# Patient Record
Sex: Male | Born: 1975 | Race: Black or African American | Hispanic: No | Marital: Single | Smoking: Current every day smoker
Health system: Southern US, Community
[De-identification: ages and names within clinical notes are randomized; demographics above are authoritative.]

## PROBLEM LIST (undated history)

## (undated) DIAGNOSIS — F329 Major depressive disorder, single episode, unspecified: Secondary | ICD-10-CM

## (undated) DIAGNOSIS — I10 Essential (primary) hypertension: Secondary | ICD-10-CM

## (undated) DIAGNOSIS — M549 Dorsalgia, unspecified: Secondary | ICD-10-CM

## (undated) DIAGNOSIS — F32A Depression, unspecified: Secondary | ICD-10-CM

---

## 2005-04-01 ENCOUNTER — Ambulatory Visit: Payer: Self-pay | Admitting: Nurse Practitioner

## 2005-04-07 ENCOUNTER — Ambulatory Visit: Payer: Self-pay | Admitting: *Deleted

## 2005-05-29 ENCOUNTER — Ambulatory Visit: Payer: Self-pay | Admitting: Nurse Practitioner

## 2008-01-30 ENCOUNTER — Ambulatory Visit: Payer: Self-pay | Admitting: Internal Medicine

## 2008-01-30 LAB — CONVERTED CEMR LAB
AST: 16 units/L (ref 0–37)
Albumin: 4.8 g/dL (ref 3.5–5.2)
Alkaline Phosphatase: 52 units/L (ref 39–117)
BUN: 14 mg/dL (ref 6–23)
Basophils Relative: 0 % (ref 0–1)
Calcium: 9.9 mg/dL (ref 8.4–10.5)
Chloride: 101 meq/L (ref 96–112)
Creatinine, Ser: 0.93 mg/dL (ref 0.40–1.50)
Eosinophils Absolute: 0.8 10*3/uL — ABNORMAL HIGH (ref 0.0–0.7)
HCT: 44.2 % (ref 39.0–52.0)
MCHC: 31.4 g/dL (ref 30.0–36.0)
MCV: 85.2 fL (ref 78.0–100.0)
Monocytes Absolute: 0.6 10*3/uL (ref 0.1–1.0)
Monocytes Relative: 9 % (ref 3–12)
Neutro Abs: 3.1 10*3/uL (ref 1.7–7.7)
Neutrophils Relative %: 44 % (ref 43–77)
Platelets: 288 10*3/uL (ref 150–400)
RBC: 5.19 M/uL (ref 4.22–5.81)
RDW: 14.6 % (ref 11.5–15.5)
Total Bilirubin: 0.4 mg/dL (ref 0.3–1.2)

## 2008-02-03 ENCOUNTER — Ambulatory Visit: Payer: Self-pay | Admitting: Internal Medicine

## 2008-04-13 ENCOUNTER — Ambulatory Visit: Payer: Self-pay | Admitting: Internal Medicine

## 2008-05-21 ENCOUNTER — Ambulatory Visit: Payer: Self-pay | Admitting: Internal Medicine

## 2008-05-28 ENCOUNTER — Ambulatory Visit: Payer: Self-pay | Admitting: Internal Medicine

## 2009-03-08 ENCOUNTER — Encounter (INDEPENDENT_AMBULATORY_CARE_PROVIDER_SITE_OTHER): Payer: Self-pay | Admitting: Adult Health

## 2009-03-08 ENCOUNTER — Ambulatory Visit: Payer: Self-pay | Admitting: Internal Medicine

## 2009-03-08 LAB — CONVERTED CEMR LAB
Basophils Absolute: 0 10*3/uL (ref 0.0–0.1)
Basophils Relative: 0 % (ref 0–1)
Hemoglobin: 14.5 g/dL (ref 13.0–17.0)
Lymphocytes Relative: 43 % (ref 12–46)
Lymphs Abs: 3.1 10*3/uL (ref 0.7–4.0)
MCHC: 33.1 g/dL (ref 30.0–36.0)
Monocytes Absolute: 0.8 10*3/uL (ref 0.1–1.0)
Monocytes Relative: 11 % (ref 3–12)
Neutro Abs: 3.1 10*3/uL (ref 1.7–7.7)
Neutrophils Relative %: 43 % (ref 43–77)
Platelets: 308 10*3/uL (ref 150–400)

## 2009-06-27 ENCOUNTER — Emergency Department (HOSPITAL_COMMUNITY): Admission: EM | Admit: 2009-06-27 | Discharge: 2009-06-27 | Payer: Self-pay | Admitting: Emergency Medicine

## 2009-07-02 ENCOUNTER — Ambulatory Visit: Payer: Self-pay | Admitting: Family Medicine

## 2009-07-22 ENCOUNTER — Ambulatory Visit: Payer: Self-pay | Admitting: Internal Medicine

## 2009-07-22 LAB — CONVERTED CEMR LAB
AST: 21 units/L (ref 0–37)
Albumin: 4.5 g/dL (ref 3.5–5.2)
Alkaline Phosphatase: 44 units/L (ref 39–117)
CRP: 0.9 mg/dL — ABNORMAL HIGH (ref <0.6)
Calcium: 9.9 mg/dL (ref 8.4–10.5)
Chloride: 106 meq/L (ref 96–112)
Hgb A1c MFr Bld: 6.2 % — ABNORMAL HIGH (ref <5.7)
Total Bilirubin: 0.3 mg/dL (ref 0.3–1.2)
Total Protein: 7.5 g/dL (ref 6.0–8.3)
Uric Acid, Serum: 9.7 mg/dL — ABNORMAL HIGH (ref 4.0–7.8)

## 2009-12-09 ENCOUNTER — Emergency Department (HOSPITAL_COMMUNITY): Admission: EM | Admit: 2009-12-09 | Discharge: 2009-12-09 | Payer: Self-pay | Admitting: Emergency Medicine

## 2010-03-23 ENCOUNTER — Encounter: Payer: Self-pay | Admitting: Internal Medicine

## 2012-08-25 ENCOUNTER — Encounter (HOSPITAL_COMMUNITY): Payer: Self-pay | Admitting: *Deleted

## 2012-08-25 ENCOUNTER — Emergency Department (HOSPITAL_COMMUNITY)
Admission: EM | Admit: 2012-08-25 | Discharge: 2012-08-26 | Disposition: A | Discharge status: Home / Self Care | Payer: Self-pay | Attending: Emergency Medicine | Admitting: Emergency Medicine

## 2012-08-25 DIAGNOSIS — F3289 Other specified depressive episodes: Secondary | ICD-10-CM | POA: Insufficient documentation

## 2012-08-25 DIAGNOSIS — F329 Major depressive disorder, single episode, unspecified: Secondary | ICD-10-CM

## 2012-08-25 DIAGNOSIS — I10 Essential (primary) hypertension: Secondary | ICD-10-CM | POA: Insufficient documentation

## 2012-08-25 DIAGNOSIS — R45851 Suicidal ideations: Secondary | ICD-10-CM | POA: Insufficient documentation

## 2012-08-25 DIAGNOSIS — M549 Dorsalgia, unspecified: Secondary | ICD-10-CM | POA: Insufficient documentation

## 2012-08-25 DIAGNOSIS — G8929 Other chronic pain: Secondary | ICD-10-CM | POA: Insufficient documentation

## 2012-08-25 HISTORY — DX: Major depressive disorder, single episode, unspecified: F32.9

## 2012-08-25 HISTORY — DX: Dorsalgia, unspecified: M54.9

## 2012-08-25 HISTORY — DX: Depression, unspecified: F32.A

## 2012-08-25 HISTORY — DX: Essential (primary) hypertension: I10

## 2012-08-25 LAB — CBC
HCT: 42.4 % (ref 39.0–52.0)
Hemoglobin: 14.2 g/dL (ref 13.0–17.0)
MCH: 27.4 pg (ref 26.0–34.0)
MCHC: 33.5 g/dL (ref 30.0–36.0)
MCV: 81.7 fL (ref 78.0–100.0)
Platelets: 267 10*3/uL (ref 150–400)
RBC: 5.19 MIL/uL (ref 4.22–5.81)
RDW: 14.3 % (ref 11.5–15.5)
WBC: 5.9 10*3/uL (ref 4.0–10.5)

## 2012-08-25 LAB — COMPREHENSIVE METABOLIC PANEL
ALT: 35 U/L (ref 0–53)
AST: 24 U/L (ref 0–37)
Albumin: 4.1 g/dL (ref 3.5–5.2)
Alkaline Phosphatase: 57 U/L (ref 39–117)
BUN: 7 mg/dL (ref 6–23)
CO2: 27 mEq/L (ref 19–32)
Calcium: 9.4 mg/dL (ref 8.4–10.5)
Chloride: 103 mEq/L (ref 96–112)
Creatinine, Ser: 0.96 mg/dL (ref 0.50–1.35)
GFR calc Af Amer: >90 mL/min (ref >90)
GFR calc non Af Amer: >90 mL/min (ref >90)
Glucose, Bld: 94 mg/dL (ref 70–99)
Potassium: 4.3 mEq/L (ref 3.5–5.1)
Sodium: 141 mEq/L (ref 135–145)
Total Bilirubin: 0.3 mg/dL (ref 0.3–1.2)
Total Protein: 8 g/dL (ref 6.0–8.3)

## 2012-08-25 LAB — ACETAMINOPHEN LEVEL: Acetaminophen (Tylenol), Serum: <15 ug/mL (ref 10–30)

## 2012-08-25 LAB — SALICYLATE LEVEL: Salicylate Lvl: <2 mg/dL — ABNORMAL LOW (ref 2.8–20.0)

## 2012-08-25 LAB — ETHANOL: Alcohol, Ethyl (B): <11 mg/dL (ref 0–11)

## 2012-08-25 MED ORDER — METHOCARBAMOL 500 MG PO TABS
500.0000 mg | ORAL_TABLET | Freq: Once | ORAL | Status: AC
  Administered 2012-08-25: 500 mg via ORAL
  Filled 2012-08-25: qty 1

## 2012-08-25 MED ORDER — ACETAMINOPHEN 325 MG PO TABS: 650.0000 mg | ORAL_TABLET | ORAL | Status: DC | PRN

## 2012-08-25 MED ORDER — IBUPROFEN 400 MG PO TABS
600.0000 mg | ORAL_TABLET | Freq: Three times a day (TID) | ORAL | Status: DC | PRN
  Administered 2012-08-25: 600 mg via ORAL
  Filled 2012-08-25: qty 1

## 2012-08-25 MED ORDER — ONDANSETRON HCL 4 MG PO TABS: 4.0000 mg | ORAL_TABLET | Freq: Three times a day (TID) | ORAL | Status: DC | PRN

## 2012-08-25 MED ORDER — OXYCODONE-ACETAMINOPHEN 5-325 MG PO TABS
1.0000 | ORAL_TABLET | Freq: Once | ORAL | Status: AC
  Administered 2012-08-25: 1 via ORAL
  Filled 2012-08-25: qty 1

## 2012-08-25 MED ORDER — NICOTINE 21 MG/24HR TD PT24: 21.0000 mg | MEDICATED_PATCH | Freq: Every day | TRANSDERMAL | Status: DC | PRN

## 2012-08-25 MED ORDER — ZOLPIDEM TARTRATE 5 MG PO TABS: 5.0000 mg | ORAL_TABLET | Freq: Every evening | ORAL | Status: DC | PRN

## 2012-08-25 MED ORDER — LORAZEPAM 1 MG PO TABS
1.0000 mg | ORAL_TABLET | Freq: Three times a day (TID) | ORAL | Status: DC | PRN
  Administered 2012-08-25: 1 mg via ORAL
  Filled 2012-08-25: qty 1

## 2012-08-25 NOTE — ED Notes (Signed)
PATIENT INITALLY DENIED HE IS SI WHEN HE ARRIVED ON POD C. STATES HE CAME TO THE ED BECAUSE HE WAS IN SEVERE PAIN DUE TO CHRONIC BACK PROBLEMS. STATES HE HAS A HISTORY OF DEPRESSION BECAUSE OF HIS CHRONIC PAIN. STATES HE IS NOT SUICIDAL AT THIS TIME BUT A YEAR AGO HE WAS. SPOKE WITH FT PA AND SHE ADVISES THAT PT "BROKE DOWN" TELLING HER HE GAVE HIS MOTHER HIS GUN THIS MORNING. STATE HE TOLD HER HE DIDN'T WANT TO LIVE. WHEN QUESTIONED PT ABOUT THIS HE DID NOT HAVE A RESPONSE. ADVISED HIM THAT WE WOULD DO A TELEPSYCH AND THE PSYCHIATRIST WOULD EVALUATE HIM. SITTER IN PLACE FOR SAFETY

## 2012-08-25 NOTE — ED Notes (Signed)
ADDRESSED PT CONCERNS ABOUT HIS BACK PAIN NOT BEING ADDRESSED. SPOKE WITH DR Blinda Leatherwood ABOUT PT CONCERNS. HE ADVISES THAT HE OR SOMEONE WILL ADDRESS THE PATIENTS CONCERNS ABOUT HIS BACK PAIN AFTER HE IS PSYCH CLEAR

## 2012-08-25 NOTE — ED Notes (Signed)
Pt tearful, reports that he feels like he is a burden to people.  Reports that he has had thoughts of wishing that God would take him away.  Denies plan, denies VH/AH.

## 2012-08-25 NOTE — ED Provider Notes (Signed)
History    CSN: [id number] Arrival date & time 08/25/12  1307  First MD Initiated Contact with Patient 08/25/12 1425     Chief Complaint  Patient presents with  . Back Pain  . Depression   (Consider location/radiation/quality/duration/timing/severity/associated sxs/prior Treatment) The history is provided by the patient.   Patient presents to the ED for chronic back pain. 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 He is been seen by neurosurgery in the past with fusion surgery recommended, however he declined to have it done at that time.  Over the past 3 days has had increased pain with spasms, some of which radiate up his back and cause a throbbing headache. Denies any new injury or trauma.  Has intermittent paresthesias of LE, not of new onset.  No loss of bowel or bladder function. Patient was on daily pain medications via [facility] but has not taken any since a shutdown.  Notes he is confined to his bed most hours of the day and uses a cane to assist with ambulation when he feels he is able to get out of bed.  He also notes recent SI secondary to his back pain.  Patient states he feels like a burden to his friends and family because he is dependent on them to help with household chores and his ADLs. At times he has thought of taking his own life and has taken measures to have his pistol removed from his home to prevent himself from doing so.  Notes he was previously diagnosed with major depressive disorder. He was on medications for some time but none currently. He has not been evaluated by psychiatry recently. Denies any HI or AVH.  Denies illicit drug or EtOH use.  Past Medical History  Diagnosis Date  . Back pain   . Hypertension   . Depression    No past surgical history on file. No family history on file. History  Substance Use Topics  . Smoking status: Not on file  . Smokeless tobacco: Not on file   . Alcohol Use: Not on file    Review of Systems  Musculoskeletal: Positive for back pain.  Psychiatric/Behavioral: Positive for suicidal ideas.  All other systems reviewed and are negative.    Allergies  Vioxx  Home Medications   Current Outpatient Rx  Name  Route  Sig  Dispense  Refill  . cyclobenzaprine (FLEXERIL) 10 MG tablet   Oral   Take 10 mg by mouth 3 (three) times daily as needed for muscle spasms.         Marland Kitchen HYDROcodone-acetaminophen (NORCO/VICODIN) 5-325 MG per tablet   Oral   Take 1 tablet by mouth every 6 (six) hours as needed for pain.          BP 135/85  Pulse 83  Temp(Src) 98.5 F (36.9 C) (Oral)  Resp 18  SpO2 99%  Physical Exam  Nursing note and vitals reviewed. Constitutional: He is oriented to person, place, and time. He appears well-developed and well-nourished. No distress.  HENT:  Head: Normocephalic and atraumatic.  Mouth/Throat: Oropharynx is clear and moist.  Eyes: Conjunctivae and EOM are normal. Pupils are equal, round, and reactive to light.  Neck: Normal range of motion. Neck supple. No rigidity.  No meningeal signs  Cardiovascular: Normal rate, regular rhythm and normal heart sounds.   Pulmonary/Chest: Effort normal and breath sounds normal. No respiratory distress. He has no wheezes.  Musculoskeletal:       Lumbar back: He exhibits decreased range of motion, tenderness, bony tenderness, pain and spasm. He exhibits no swelling, no edema, no deformity, no laceration and normal pulse.  Diffuse tenderness of lumbar spine, spasms present bilaterally, positive straight leg raise bilaterally- R>L, distal sensation intact  Neurological: He is alert and oriented to person, place, and time.  Skin: Skin is warm and dry.  Psychiatric: He has a normal mood and affect. His speech is normal. He expresses suicidal ideation. He expresses no homicidal ideation. He expresses no suicidal plans and no homicidal plans.  Suicidal thoughts without plan     ED Course  Procedures (including critical care time) Labs Reviewed  SALICYLATE LEVEL - Abnormal; Notable for the following:    Salicylate Lvl <2.0 (*)    All other components within normal limits  ACETAMINOPHEN LEVEL  CBC  COMPREHENSIVE METABOLIC PANEL  ETHANOL  URINE RAPID DRUG SCREEN (HOSP PERFORMED)   No results found.  1. Back pain   2. Depression     MDM   Patient with chronic back pain secondary to herniated disc confirmed by MRI in 2011.  Intermittent LE paresthesias without saddle numbness/paresthesias or loss of bowel/bladder function- doubt SCI, cauda equina, or other neurovascular compromise.  Do not feel that repeat MRI is emergently needed. Will plan to manage pain- percocet and robaxin given.  Patient expresses recent SI without specific plan- this may be partially due to his depression and progressing immobility but i feel this needs further evaluation.  Psych holding orders and home meds placed.  Telepsych pending.  Garlon Hatchet, PA-C 08/26/12 [phone]

## 2012-08-25 NOTE — ED Notes (Signed)
Consulting civil engineer, security and house coverage notified.

## 2012-08-25 NOTE — ED Notes (Signed)
Pt reports chronic back pain.  States that he has had pain x 3 days.  Reports pain has been shooting up his back.  Reports spasms in his back that has not decreased in 3 days.  Pt reports being out of pain medications since [facility] shut down.

## 2012-08-26 MED ORDER — DULOXETINE HCL 60 MG PO CPEP: 60.0000 mg | ORAL_CAPSULE | Freq: Every day | ORAL | Status: DC

## 2012-08-26 MED ORDER — OXYCODONE-ACETAMINOPHEN 5-325 MG PO TABS: 1.0000 | ORAL_TABLET | Freq: Three times a day (TID) | ORAL | Status: DC | PRN

## 2012-08-26 NOTE — ED Notes (Signed)
Pt belongings given back to pt and signed out by patient. Obtained locked items from security and reviewed with Engineer, materials and signed out by pt. Pt given d/c teaching, prescriptions, and follow up care instructions. Pt declined PRN ibuprofen upon d/c and states he will go pick up his prescription for pain management today. Pt alert and mentating appropriately upon d/c. NAD noted upon d/c. Pt has no further questions upon d/c. Pt states he prefers to wait in lobby for his ride to come pick him up. Heather, EMT transported pt to lobby via wheelchair. Pt leaving with d/c teaching and prescriptions.

## 2012-08-26 NOTE — BH Assessment (Addendum)
Assessment Note     Patient is a 37 year old black male. Patient reports feelings of depression. Patient reports that he feels like he is a burden to people.  Patient reports that he gave his gun to his mother over a month ago. Patient denies any SI/HI.  Patient reports that he is able to contract for cable.  Patient reports that his feelings of depression are associated with his inability to access resources for him to resume receiving insurance so that he is able to obtain medication pain management.  Patient reports that he is able to contract for safety.  Patient will receive a Tele Psych to determine the disposition of the patient.  Patient denies substance abuse.  Patient BAL was <11.         Axis I: Major Depression, Recurrent severe Axis II: Deferred Axis III:  Past Medical History  Diagnosis Date  . Back pain   . Hypertension   . Depression    Axis IV: economic problems, occupational problems, other psychosocial or environmental problems, problems related to social environment, problems with access to health care services and problems with primary support group Axis V: 31-40 impairment in reality testing  Past Medical History:  Past Medical History  Diagnosis Date  . Back pain   . Hypertension   . Depression     No past surgical history on file.  Family History: No family history on file.  Social History:  has no tobacco, alcohol, and drug history on file.  Additional Social History:     CIWA: CIWA-Ar BP: 126/83 mmHg Pulse Rate: 67 COWS:    Allergies:  Allergies  Allergen Reactions  . Vioxx (Rofecoxib)     Home Medications:  (Not in a hospital admission)  OB/GYN Status:  No LMP for male patient.  General Assessment Data Location of Assessment: [facility] ED ACT Assessment: Yes Living Arrangements: Spouse/significant other Can pt return to current living arrangement?: Yes Admission Status: Voluntary Is patient capable of signing voluntary admission?:  Yes Transfer from: Acute Hospital Referral Source: Self/Family/Friend  Education Status Is patient currently in school?: No  Risk to self Suicidal Ideation: Yes-Currently Present Suicidal Intent: No Is patient at risk for suicide?: No Suicidal Plan?: No Access to Means: No What has been your use of drugs/alcohol within the last 12 months?: None Previous Attempts/Gestures: No How many times?: 0 Other Self Harm Risks: None Triggers for Past Attempts: Unpredictable Intentional Self Injurious Behavior: None Family Suicide History: No Recent stressful life event(s): Conflict (Comment);Job Loss;Financial Problems Persecutory voices/beliefs?: No Depression: Yes Depression Symptoms: Despondent;Insomnia;Tearfulness;Isolating;Fatigue;Guilt;Loss of interest in usual pleasures;Feeling worthless/self pity;Feeling angry/irritable Substance abuse history and/or treatment for substance abuse?: No Suicide prevention information given to non-admitted patients: Not applicable  Risk to Others Homicidal Ideation: No Thoughts of Harm to Others: No Current Homicidal Intent: No Current Homicidal Plan: No Access to Homicidal Means: No Identified Victim: None (None Reported) History of harm to others?: No Assessment of Violence: None Noted Violent Behavior Description: calm Does patient have access to weapons?: No Criminal Charges Pending?: No Does patient have a court date: No  Psychosis Hallucinations: None noted Delusions: None noted  Mental Status Report Appear/Hygiene: Disheveled Eye Contact: Fair Motor Activity: Freedom of movement Speech: Logical/coherent Level of Consciousness: Alert Mood: Depressed;Despair Affect: Depressed Anxiety Level: None Thought Processes: Coherent;Relevant Judgement: Unimpaired Orientation: Person;Place;Time;Situation Obsessive Compulsive Thoughts/Behaviors: None  Cognitive Functioning Concentration: Decreased Memory: Recent Intact;Remote  Intact IQ: Average Insight: Fair Impulse Control: Poor Appetite: Poor Weight Loss: 0  Weight Gain: 0 Sleep: Decreased Total Hours of Sleep: 3 Vegetative Symptoms: None  ADLScreening [facility]) Patient's cognitive ability adequate to safely complete daily activities?: Yes Patient able to express need for assistance with ADLs?: Yes Independently performs ADLs?: Yes (appropriate for developmental age)  Abuse/Neglect [facility]) Physical Abuse: Denies Verbal Abuse: Denies Sexual Abuse: Denies  Prior Inpatient Therapy Prior Inpatient Therapy: No Prior Therapy Dates: na Prior Therapy Facilty/Provider(s): na Reason for Treatment: na  Prior Outpatient Therapy Prior Outpatient Therapy: Yes Prior Therapy Dates: 2013 Prior Therapy Facilty/Provider(s): [facility]  Reason for Treatment: Depressio   ADL Screening (condition at time of admission) Patient's cognitive ability adequate to safely complete daily activities?: Yes Patient able to express need for assistance with ADLs?: Yes Independently performs ADLs?: Yes (appropriate for developmental age)       Abuse/Neglect Assessment (Assessment to be complete while patient is alone) Physical Abuse: Denies Verbal Abuse: Denies Sexual Abuse: Denies       Nutrition Screen- [facility] Adult/[facility]/AP Patient's home diet: Regular (snack and drinks given)  Additional Information 1:1 In Past 12 Months?: No CIRT Risk: No Elopement Risk: No Does patient have medical clearance?: Yes     Disposition: Pending Tele Psych RecommendationsDisposition Initial Assessment Completed for this Encounter: Yes Disposition of Patient: Referred to Patient referred to: Other (Comment)  On Site Evaluation by:   Reviewed with Physician:     Phillip Heal LaVerne 08/26/2012 3:51 AM

## 2012-08-26 NOTE — ED Provider Notes (Signed)
BP 118/75  Pulse 71  Temp(Src) 98.7 F (37.1 C) (Oral)  Resp 18  SpO2 94% Pt seen by telepsych and cleared for d/c home Pt denies SI telepsych recommends starting cymbalta 60mg  daily For his pain, reports acute on chronic pain.  He does not have pain meds at home He seems motivated for outpatient management and referrals given to patient He is able to ambulate and stand with his cane which is usual for him.   He does not appear to have acute neurologic emergency I feel he is safe for d/c home and short course of pain meds given  Joya Gaskins, MD 08/26/12 [phone]

## 2012-08-26 NOTE — ED Notes (Signed)
Psychiatry called and telepsych set up for patient to have his conference now.

## 2012-08-27 NOTE — ED Provider Notes (Signed)
Medical screening examination/treatment/procedure(s) were performed by non-physician practitioner and as supervising physician I was immediately available for consultation/collaboration.  Josmar Messimer, MD 08/27/12 1703 

## 2018-03-09 ENCOUNTER — Emergency Department (HOSPITAL_COMMUNITY)
Admission: EM | Admit: 2018-03-09 | Discharge: 2018-03-09 | Disposition: A | Discharge status: Home / Self Care | Payer: Self-pay | Attending: Emergency Medicine | Admitting: Emergency Medicine

## 2018-03-09 ENCOUNTER — Encounter (HOSPITAL_COMMUNITY): Payer: Self-pay | Admitting: Emergency Medicine

## 2018-03-09 ENCOUNTER — Emergency Department (HOSPITAL_COMMUNITY): Payer: Self-pay

## 2018-03-09 DIAGNOSIS — M545 Low back pain, unspecified: Secondary | ICD-10-CM

## 2018-03-09 DIAGNOSIS — W19XXXA Unspecified fall, initial encounter: Secondary | ICD-10-CM | POA: Insufficient documentation

## 2018-03-09 DIAGNOSIS — Y998 Other external cause status: Secondary | ICD-10-CM | POA: Insufficient documentation

## 2018-03-09 DIAGNOSIS — I10 Essential (primary) hypertension: Secondary | ICD-10-CM | POA: Insufficient documentation

## 2018-03-09 DIAGNOSIS — Y9289 Other specified places as the place of occurrence of the external cause: Secondary | ICD-10-CM | POA: Insufficient documentation

## 2018-03-09 DIAGNOSIS — Y9301 Activity, walking, marching and hiking: Secondary | ICD-10-CM | POA: Insufficient documentation

## 2018-03-09 DIAGNOSIS — R531 Weakness: Secondary | ICD-10-CM | POA: Insufficient documentation

## 2018-03-09 MED ORDER — METHOCARBAMOL 500 MG PO TABS: 500.0000 mg | ORAL_TABLET | Freq: Two times a day (BID) | ORAL | 0 refills | Status: DC

## 2018-03-09 MED ORDER — OXYCODONE-ACETAMINOPHEN 5-325 MG PO TABS
1.0000 | ORAL_TABLET | Freq: Once | ORAL | Status: AC
  Administered 2018-03-09: 1 via ORAL
  Filled 2018-03-09: qty 1

## 2018-03-09 MED ORDER — OXYCODONE-ACETAMINOPHEN 5-325 MG PO TABS: 1.0000 | ORAL_TABLET | Freq: Four times a day (QID) | ORAL | 0 refills | Status: DC | PRN

## 2018-03-09 NOTE — Discharge Instructions (Addendum)
Please read attached information. If you experience any new or worsening signs or symptoms please return to the emergency room for evaluation. Please follow-up with your primary care provider or specialist as discussed. Please use medication prescribed only as directed and discontinue taking if you have any concerning signs or symptoms.   °

## 2018-03-09 NOTE — ED Triage Notes (Signed)
Pt arrives with chronic back pain using cane to walk due to herniated disc in back.  Pt states today while walking down ramp he had a fall and is hurting in lower back. Denies any LOc- did hit head on rail.

## 2018-03-09 NOTE — ED Notes (Signed)
Patient given discharge instructions and verbalized understanding.  Patient stable to discharge at this time.  Patient is alert and oriented to baseline.  No distressed noted at this time.  All belongings taken with the patient at discharge.   

## 2018-03-09 NOTE — ED Provider Notes (Signed)
[facility] EMERGENCY DEPARTMENT Provider Note   CSN: [id number] Arrival date & time: 03/09/18  1232   History   Chief Complaint Chief Complaint  Patient presents with  . Back Pain    HPI Blake Hampton is a 43 y.o. male.  HPI   43 year old male with a history of back pain, depression, hypertension presents today with complaints of low back pain.  Patient notes chronic back pain which requires him to use a cane.  He notes he was walking down a ramp when he felt a sharp pain in his low back that radiated to his neck.  He felt weakness in his bilateral legs and fell.  Patient notes he did hit his head although very lightly with no headache or neck pain.  He notes he is having pain in his lower back slightly more severe than previous.  He notes some greater weakness in his left lower extremity that is chronic for him also some generalized loss of sensation to the leg.  He notes that he has been seen in the past but has not followed up with neurosurgery as he did not have the resources to do this.  Patient notes he uses Tylenol or ibuprofen at home when he needs pain medicine.  Patient denies any other red flags including fever, IV drug use, or any changes distal neurological deficits.  Past Medical History:  Diagnosis Date  . Back pain   . Depression   . Hypertension     There are no active problems to display for this patient.   History reviewed. No pertinent surgical history.      Home Medications    Prior to Admission medications   Medication Sig Start Date End Date Taking? Authorizing Provider  DULoxetine (CYMBALTA) 60 MG capsule Take 1 capsule (60 mg total) by mouth daily. 08/26/12   Zadie Rhine, MD  methocarbamol (ROBAXIN) 500 MG tablet Take 1 tablet (500 mg total) by mouth 2 (two) times daily. 03/09/18   Tram Wrenn, Tinnie Gens, PA-C  oxyCODONE-acetaminophen (PERCOCET/ROXICET) 5-325 MG tablet Take 1 tablet by mouth every 6 (six) hours as needed. 03/09/18    Eyvonne Mechanic, PA-C    Family History History reviewed. No pertinent family history.  Social History Social History   Tobacco Use  . Smoking status: Not on file  Substance Use Topics  . Alcohol use: Not on file  . Drug use: Not on file     Allergies   Vioxx [rofecoxib]   Review of Systems Review of Systems  All other systems reviewed and are negative.    Physical Exam Updated Vital Signs BP (!) 108/59 (BP Location: Right Arm)   Pulse 83   Temp 98.9 F (37.2 C) (Oral)   Resp 18   SpO2 99%   Physical Exam Vitals signs and nursing note reviewed.  Constitutional:      Appearance: He is well-developed.  HENT:     Head: Normocephalic and atraumatic.  Eyes:     General: No scleral icterus.       Right eye: No discharge.        Left eye: No discharge.     Conjunctiva/sclera: Conjunctivae normal.     Pupils: Pupils are equal, round, and reactive to light.  Neck:     Musculoskeletal: Normal range of motion.     Vascular: No JVD.     Trachea: No tracheal deviation.  Pulmonary:     Effort: Pulmonary effort is normal.     Breath  sounds: No stridor.  Musculoskeletal:     Comments: Tenderness to the musculature of the mid to lower lumbar region, no point tenderness along the cervical thoracic or lumbar spinous process-bilateral lower extremity strength intact, sensation slightly reduced to the mid thigh down-no loss of sensation to the right lower extremity.  Neurological:     Mental Status: He is alert and oriented to person, place, and time.     Coordination: Coordination normal.  Psychiatric:        Behavior: Behavior normal.        Thought Content: Thought content normal.        Judgment: Judgment normal.      ED Treatments / Results  Labs (all labs ordered are listed, but only abnormal results are displayed) Labs Reviewed - No data to display  EKG None  Radiology Dg Lumbar Spine Complete  Result Date: 03/09/2018 CLINICAL DATA:  Low back pain  today.  Patient fell. EXAM: LUMBAR SPINE - COMPLETE 4+ VIEW COMPARISON:  Lumbar MRI 06/27/2009. FINDINGS: Five lumbar type vertebral bodies. There is mild reversal of the usual lumbar lordosis, but no focal angulation or significant listhesis. Stable mild disc space narrowing and intervertebral spurring from L2-3 through L5-S1. Mild facet degenerative changes inferiorly. No evidence of acute fracture or pars defect. IMPRESSION: No acute osseous findings.  Mild spondylosis. Electronically Signed   By: Carey Bullocks M.D.   On: 03/09/2018 13:55    Procedures Procedures (including critical care time)  Medications Ordered in ED Medications  oxyCODONE-acetaminophen (PERCOCET/ROXICET) 5-325 MG per tablet 1 tablet (1 tablet Oral Given 03/09/18 1430)     Initial Impression / Assessment and Plan / ED Course  I have reviewed the triage vital signs and the nursing notes.  Pertinent labs & imaging results that were available during my care of the patient were reviewed by me and considered in my medical decision making (see chart for details).     Assessment/Plan: 43 year old male presents today with complaints of low back pain.  Patient has no acute changes to his distal neurological complaints.  He has no signs of fracture.  Patient will need outpatient follow-up given his chronicity of symptoms, no acute need for emergent management at this time.  He is given strict return precautions, verbalized understanding and agreement to today's plan had no further questions or concerns    Final Clinical Impressions(s) / ED Diagnoses   Final diagnoses:  Midline low back pain without sciatica, unspecified chronicity    ED Discharge Orders         Ordered    oxyCODONE-acetaminophen (PERCOCET/ROXICET) 5-325 MG tablet  Every 6 hours PRN     03/09/18 1422    methocarbamol (ROBAXIN) 500 MG tablet  2 times daily     03/09/18 1422           Eyvonne Mechanic, PA-C 03/09/18 1516    Sabas Sous,  MD 03/11/18 1257

## 2018-09-23 ENCOUNTER — Other Ambulatory Visit: Payer: Self-pay

## 2018-09-23 DIAGNOSIS — Z20822 Contact with and (suspected) exposure to covid-19: Secondary | ICD-10-CM

## 2018-09-26 LAB — NOVEL CORONAVIRUS, NAA: SARS-CoV-2, NAA: NOT DETECTED

## 2019-05-19 ENCOUNTER — Ambulatory Visit: Payer: PRIVATE HEALTH INSURANCE | Attending: Internal Medicine

## 2019-05-19 DIAGNOSIS — Z20822 Contact with and (suspected) exposure to covid-19: Secondary | ICD-10-CM | POA: Insufficient documentation

## 2019-05-20 LAB — NOVEL CORONAVIRUS, NAA: SARS-CoV-2, NAA: NOT DETECTED

## 2019-05-23 ENCOUNTER — Telehealth: Payer: Self-pay

## 2019-05-23 NOTE — Telephone Encounter (Signed)
Pt. Given COVID results, verbalizes understanding.

## 2019-06-17 ENCOUNTER — Ambulatory Visit: Payer: Self-pay | Attending: Internal Medicine

## 2019-06-17 DIAGNOSIS — Z23 Encounter for immunization: Secondary | ICD-10-CM

## 2019-06-17 NOTE — Progress Notes (Signed)
   Covid-19 Vaccination Clinic  Name:  JOHNWESLEY LEDERMAN    MRN: [id number] DOB: 26-Jun-1975  06/17/2019  Mr. Piscopo was observed post Covid-19 immunization for 15 minutes without incident. He was provided with Vaccine Information Sheet and instruction to access the V-Safe system.   Mr. Shifflett was instructed to call 911 with any severe reactions post vaccine: Marland Kitchen Difficulty breathing  . Swelling of face and throat  . A fast heartbeat  . A bad rash all over body  . Dizziness and weakness   Immunizations Administered    Name Date Dose VIS Date Route   Pfizer COVID-19 Vaccine 06/17/2019  2:43 PM 0.3 mL 02/10/2019 Intramuscular   Manufacturer: [employer], [employer]   Lot: [id number]   NDC: [address]-2

## 2019-07-11 ENCOUNTER — Ambulatory Visit: Payer: Self-pay | Attending: Internal Medicine

## 2019-07-11 DIAGNOSIS — Z23 Encounter for immunization: Secondary | ICD-10-CM

## 2019-07-11 NOTE — Progress Notes (Signed)
   Covid-19 Vaccination Clinic  Name:  DEAKEN JURGENS    MRN: [id number] DOB: [birth year]  07/11/2019  Mr. Rickman was observed post Covid-19 immunization for 15 minutes without incident. He was provided with Vaccine Information Sheet and instruction to access the V-Safe system.   Mr. Norberto was instructed to call 911 with any severe reactions post vaccine: Marland Kitchen Difficulty breathing  . Swelling of face and throat  . A fast heartbeat  . A bad rash all over body  . Dizziness and weakness   Immunizations Administered    Name Date Dose VIS Date Route   Pfizer COVID-19 Vaccine 07/11/2019  3:05 PM 0.3 mL 04/26/2018 Intramuscular   Manufacturer: [employer], [employer]   Lot: [id number]   NDC: [address]-7

## 2019-08-16 ENCOUNTER — Other Ambulatory Visit: Payer: Self-pay

## 2019-08-16 ENCOUNTER — Ambulatory Visit (INDEPENDENT_AMBULATORY_CARE_PROVIDER_SITE_OTHER): Payer: PRIVATE HEALTH INSURANCE | Admitting: Family Medicine

## 2019-08-16 ENCOUNTER — Encounter: Payer: Self-pay | Admitting: Family Medicine

## 2019-08-16 DIAGNOSIS — M545 Low back pain: Secondary | ICD-10-CM | POA: Diagnosis not present

## 2019-08-16 DIAGNOSIS — G8929 Other chronic pain: Secondary | ICD-10-CM

## 2019-08-16 NOTE — Progress Notes (Signed)
   Office Visit Note   Patient: Blake Hampton           Date of Birth: 03-12-1975           MRN: [id number] Visit Date: 08/16/2019 Requested by: No referring provider defined for this encounter. PCP: Dayna Barker (Inactive)  Subjective: Chief Complaint  Patient presents with  . Lower Back - Pain    HPI: He is here at the request of vocational rehab for low back pain.  He states that around 20 years ago he injured his back at work.  At that time he was referred to physical therapy, and given some medications.  He has had problems with his back ever since then.  In 2011 he underwent lumbar MRI scan.  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  He has not had surgery.  He has not been able to work for years.  He states that he cannot walk very long, and in fact he is using crutches or a cane for support with ambulation.  His legs give out sometimes.  He is not sure if it is related, but he has had erectile dysfunction as well.  No incontinence.  Pain travels into both legs and he has weakness in both legs.               ROS:   All other systems were reviewed and are negative.  Objective: Vital Signs: There were no vitals taken for this visit.  Physical Exam:  General:  Alert and oriented, in no acute distress. Pulm:  Breathing unlabored. Psy:  Normal mood, congruent affect.  Low back: He is tender to palpation from around L3-S1.  Straight leg raise causes pain on both sides, especially on the right.  He has significant weakness in all muscle groups tested.  Not sure if it is true weakness or weakness related to pain.  DTRs are hypoactive in the knee and ankle.  Imaging: None today, but x-rays from 2020 were reviewed on computer showing mild lumbar degenerative changes.  Assessment & Plan: 1.  Chronic low back pain with bilateral leg weakness, history of  spinal stenosis and disc protrusion on MRI scan in 2011. -I think he would benefit from a new MRI scan.  Depending on the results, could consider surgical consult or possibly injections, although I believe he has had those in the past with no improvement.     Procedures: No procedures performed  No notes on file     PMFS History: There are no problems to display for this patient.  Past Medical History:  Diagnosis Date  . Back pain   . Depression   . Hypertension     History reviewed. No pertinent family history.  History reviewed. No pertinent surgical history. Social History   Occupational History  . Not on file  Tobacco Use  . Smoking status: Not on file  Substance and Sexual Activity  . Alcohol use: Not on file  . Drug use: Not on file  . Sexual activity: Not on file

## 2019-08-18 ENCOUNTER — Telehealth: Payer: Self-pay | Admitting: Family Medicine

## 2019-08-18 NOTE — Telephone Encounter (Signed)
08/16/2019 OV note faxed to [facility]/referring office [phone]

## 2019-09-22 ENCOUNTER — Other Ambulatory Visit: Payer: Self-pay

## 2019-09-22 ENCOUNTER — Ambulatory Visit
Admission: RE | Admit: 2019-09-22 | Discharge: 2019-09-22 | Disposition: A | Discharge status: Home / Self Care | Payer: PRIVATE HEALTH INSURANCE | Source: Ambulatory Visit | Attending: Family Medicine | Admitting: Family Medicine

## 2019-09-22 DIAGNOSIS — G8929 Other chronic pain: Secondary | ICD-10-CM

## 2019-09-25 ENCOUNTER — Telehealth: Payer: Self-pay | Admitting: Family Medicine

## 2019-09-25 NOTE — Telephone Encounter (Signed)
Patient called asked for a call back concerning the results of his MRI. The number to contact patient is [phone]

## 2019-09-25 NOTE — Telephone Encounter (Signed)
Left message on voice mail  to call back

## 2019-09-25 NOTE — Telephone Encounter (Signed)
Duplicate. Left message on voice mail to call back.

## 2019-09-25 NOTE — Telephone Encounter (Signed)
Pt would like to Know if Dr.Hilts would be willing to give him his MRI results over the phone? If not the pt has an appt on 10/02/19.  [phone]

## 2019-09-25 NOTE — Telephone Encounter (Signed)
MRI looks very similar to the one in 2011, with degeneration of some of the discs, and a left-sided disc herniation at L5-S1 which contacts the left L5 nerve.  Treatment options include:  - another trial of physical thearpy - referral for an epidural steroid injection - referral to a surgeon

## 2019-09-26 ENCOUNTER — Telehealth: Payer: Self-pay | Admitting: Family Medicine

## 2019-09-26 NOTE — Telephone Encounter (Signed)
Pt called wanting his results; I made him aware of Dr.Hilts last message and his treatment options; pt states he would like to hear what Dr.Hilts suggest first and would like a CB from him.   [phone]

## 2019-09-26 NOTE — Telephone Encounter (Signed)
Original message forwarded to Dr. Prince Rome with this info.

## 2019-09-26 NOTE — Telephone Encounter (Signed)
I spoke with the patient. He would like to be set up with a surgeon for consult. Cassandra scheduled him for 10/11/19 at 10:30 with Dr. Ophelia Charter - she will contact [facility] for a new referral for this visit/. The patient was advised. He also mentioned he has had neck pain with headache. The patient already has an appointment with Dr. Prince Rome on 10/02/19 and would like the neck pain evaluated. He will contact his voc rehab advisor about this to see if he can get a referral for this problem/visit.

## 2019-09-26 NOTE — Telephone Encounter (Signed)
The patient would like to know which option you recommend - would like a call back from you on this.

## 2019-09-26 NOTE — Telephone Encounter (Signed)
Since he has tried PT in the past, and I believe he also had injections in the past, and those things didn't help, I would suggest consultation with a surgeon.  If he is in agreement, please schedule with MY or JN.

## 2019-09-26 NOTE — Telephone Encounter (Signed)
See original message on MRI results.

## 2019-09-27 ENCOUNTER — Telehealth: Payer: Self-pay | Admitting: Family Medicine

## 2019-09-27 NOTE — Telephone Encounter (Signed)
Records 6/16-present faxed to [facility] [phone]

## 2019-10-02 ENCOUNTER — Encounter: Payer: Self-pay | Admitting: Family Medicine

## 2019-10-02 ENCOUNTER — Ambulatory Visit (INDEPENDENT_AMBULATORY_CARE_PROVIDER_SITE_OTHER): Payer: PRIVATE HEALTH INSURANCE | Admitting: Family Medicine

## 2019-10-02 ENCOUNTER — Other Ambulatory Visit: Payer: Self-pay

## 2019-10-02 DIAGNOSIS — G8929 Other chronic pain: Secondary | ICD-10-CM

## 2019-10-02 DIAGNOSIS — M545 Low back pain: Secondary | ICD-10-CM

## 2019-10-02 NOTE — Progress Notes (Signed)
   Office Visit Note   Patient: Blake Hampton           Date of Birth: 20-Feb-1976           MRN: [id number] Visit Date: 10/02/2019 Requested by: No referring provider defined for this encounter. PCP: Dayna Barker (Inactive)  Subjective: Chief Complaint  Patient presents with  . Lower Back - Pain, Follow-up    Pain in the right leg, most of the time. Now having pain in both knees. The pain shoots down the right leg when he has spasms in his back. Ambulating with 2 crutches for support today.    HPI: He is here for follow-up chronic back pain, with mostly right leg radicular symptoms.  He is ambulating with crutches again today.  Recent MRI scan showed slight worsening of his L5-S1 protrusion with a left-sided herniation contacting the left L5 nerve root.  The right foramen is narrowed as well.              ROS:   All other systems were reviewed and are negative.  Objective: Vital Signs: There were no vitals taken for this visit.  Physical Exam:  General:  Alert and oriented, in no acute distress. Pulm:  Breathing unlabored. Psy:  Normal mood, congruent affect.  No exam done today.  Imaging: See report.  Assessment & Plan: 1.  Chronic low back pain with right-sided radicular symptoms due to L5-S1 protrusion -We had a lengthy discussion about treatment options.  Elected to refer him for an epidural steroid injection, followed by physical therapy.  He will also consult with Dr. Ophelia Charter to discuss surgical options in case he fails conservative management.     Procedures: No procedures performed  No notes on file     PMFS History: There are no problems to display for this patient.  Past Medical History:  Diagnosis Date  . Back pain   . Depression   . Hypertension     No family history on file.  No past surgical history on file. Social History   Occupational History  . Not on file  Tobacco Use  . Smoking status: Not on file  Substance and Sexual Activity    . Alcohol use: Not on file  . Drug use: Not on file  . Sexual activity: Not on file

## 2019-10-11 ENCOUNTER — Ambulatory Visit (INDEPENDENT_AMBULATORY_CARE_PROVIDER_SITE_OTHER): Payer: PRIVATE HEALTH INSURANCE | Admitting: Orthopaedic Surgery

## 2019-10-11 ENCOUNTER — Ambulatory Visit (INDEPENDENT_AMBULATORY_CARE_PROVIDER_SITE_OTHER): Payer: Self-pay

## 2019-10-11 ENCOUNTER — Ambulatory Visit: Payer: Self-pay

## 2019-10-11 VITALS — Ht 71.0 in | Wt 240.0 lb

## 2019-10-11 DIAGNOSIS — M542 Cervicalgia: Secondary | ICD-10-CM

## 2019-10-11 DIAGNOSIS — M25511 Pain in right shoulder: Secondary | ICD-10-CM

## 2019-10-11 DIAGNOSIS — G8929 Other chronic pain: Secondary | ICD-10-CM

## 2019-10-11 NOTE — Progress Notes (Addendum)
Office Visit Note   Patient: Blake Hampton           Date of Birth: 06-Mar-1975           MRN: [id number] Visit Date: 10/11/2019              Requested by: No referring provider defined for this encounter. PCP: Dayna Barker (Inactive)   Assessment & Plan: Visit Diagnoses:  1. Neck pain   2. Chronic right shoulder pain   3.      Low back pain with lumbar disc protrusion  Plan: Patient's exam demonstrates symptom magnification.  He has had trips to the ER and 2 MRI scans that shows some slight progression of disc degeneration in the lower lumbar region.  We discussed weight loss, dieting to help unload his back.  I think a single epidural would be worthwhile to see if he gets some relief.  I discussed with him that no surgery is recommended at this point.  It is difficult to determine what his work capacity truly is based on the findings of his physical exam.  January 2020 he was ambulatory with a cane in the emergency room visit on 03/09/2018.  Patient now is using crutches and is sitting in a wheelchair. Recommend proceeding with single ESI  Lumbar spine.   Follow-Up Instructions: No follow-ups on file.   Orders:  Orders Placed This Encounter  Procedures  . XR Cervical Spine 2 or 3 views  . XR Shoulder Right   No orders of the defined types were placed in this encounter.     Procedures: No procedures performed   Clinical Data: No additional findings.   Subjective: Chief Complaint  Patient presents with  . Lower Back - Pain  . Neck - Pain    HPI 44 year old male sent to me by Dr. Prince Rome is being followed by vocational rehabilitation.  Patient states that he has had back pain for years he last worked 2001 for [employer] which at that time was known as [employer].  After that is done different jobs which are less vigorous.  He is applying for disability has an attorney states he now switched and has another attorney.  Patient has been ordered for single epidural pending  authorization.  Patient states he is having trouble walking.  He has been under considerable stress since a close associate passed away with Covid.  He states she had been vaccinated.  Patient is used Surveyor, quantity also Percocet in the past.  [place] narcotic site list 0, 0 and 0 for scale.  Patient states she is having sharp burning pains and pain radiates into his right toe if he sits on the toilet for too long.  He states he has pain in his neck that radiates more into his right shoulder than the left shoulder.  States has had falls and has hit his neck and shoulder.  He has problems when he tries to raise his shoulder and states his arm feels "heavy".  He is in a wheelchair and states he has problems just lifting his leg off the foot rest of the wheelchair.  Patient has crutches that he uses when he gets up.  Patient had MRI scan 2011 which showed some congenital narrowing with superimposed degenerative changes L3-4 through L5-S1.  There was some disc protrusion on the left at L5-S1 and some mild thecal narrowing at L3-4.  New MRI 09/23/2019 with comparison to previous MRI 10 years ago shows there remains some  disc degeneration L3-4 and L4-5.  There is some left foraminal stenosis with left subarticular recess narrowing.  Patient has diffusely patent thecal sac.  L4-5 shows annular fissure with slight increase from previous scan 10 years ago but noncompressive.  Foramina are patent at L4-5. Patient's been on muscle relaxants, Cymbalta.  Is also used Tylenol. Review of Systems 14 point point review of systems noncontributory other than as mentioned HPI.   Objective: Vital Signs: Ht 5\' 11"  (1.803 m)   Wt 240 lb (108.9 kg)   BMI 33.47 kg/m   Physical Exam Constitutional:      Appearance: He is well-developed.  HENT:     Head: Normocephalic and atraumatic.  Eyes:     Pupils: Pupils are equal, round, and reactive to light.  Neck:     Thyroid: No thyromegaly.     Trachea: No tracheal  deviation.  Cardiovascular:     Rate and Rhythm: Normal rate.  Pulmonary:     Effort: Pulmonary effort is normal.     Breath sounds: No wheezing.  Abdominal:     General: Bowel sounds are normal.     Palpations: Abdomen is soft.  Skin:    General: Skin is warm and dry.     Capillary Refill: Capillary refill takes less than 2 seconds.  Neurological:     Mental Status: He is alert and oriented to person, place, and time.  Psychiatric:        Behavior: Behavior normal.        Thought Content: Thought content normal.        Judgment: Judgment normal.     Ortho Exam patient has difficulty getting his foot off the wheelchair foot rest.  With attempts at flexion of the hip he counter pulls in extension with attempts at knee extension he contracts his hamstring.  With request for ankle dorsiflexion plantarflexion there is post opposite muscle contraction.  With significant verbal encouragement he does demonstrate some dorsiflexion of the ankle weak with giving way cogwheel resistance.  Knee and ankle jerk are 1+ and symmetrical.  Upper extremity reflexes are 1+ and symmetrical biceps triceps brachial radialis.  He can get his hand to his head on the right side but states he has increased pain reaching overhead.  Specialty Comments:  No specialty comments available.  Imaging: Multiple views right shoulder obtained and reviewed negative for acute or chronic changes.  Impression: Normal right shoulder x-rays.  P lateral cervical spine x-rays demonstrates mild anterior spurring at C3-4 and C5-6 with normal lordosis and maintained disc space height.  Impression: Negative for acute changes cervical spine.  Mild anterior spurring as described above.   PMFS History: There are no problems to display for this patient.  Past Medical History:  Diagnosis Date  . Back pain   . Depression   . Hypertension     No family history on file.  No past surgical history on file. Social History    Occupational History  . Not on file  Tobacco Use  . Smoking status: Not on file  Substance and Sexual Activity  . Alcohol use: Not on file  . Drug use: Not on file  . Sexual activity: Not on file

## 2019-10-13 ENCOUNTER — Telehealth: Payer: Self-pay | Admitting: Orthopaedic Surgery

## 2019-10-13 NOTE — Telephone Encounter (Signed)
I called discussed.  

## 2019-10-13 NOTE — Telephone Encounter (Signed)
Please see below and advise.

## 2019-10-13 NOTE — Telephone Encounter (Signed)
Pt called stating he needs a treatment plan vocational therapy; it needs to contain the expected time for treatment and recovery in order for them to continue to pay. Shelia at vocational therapy would like for Dr.Yates and Dr.Hilts to discuss together.  Pt would like two copies left at the front desk for pick up and a CB when ready.  [phone]

## 2019-10-13 NOTE — Telephone Encounter (Signed)
noted 

## 2019-10-18 ENCOUNTER — Ambulatory Visit: Payer: PRIVATE HEALTH INSURANCE | Admitting: Rehabilitative and Restorative Service Providers"

## 2019-10-24 ENCOUNTER — Telehealth: Payer: Self-pay | Admitting: Orthopaedic Surgery

## 2019-10-24 NOTE — Telephone Encounter (Signed)
Pt called stating he needs a written treatment plan that can be either picked up or faxed; pt would like to get this to vocational rehab as soon as possible. Pt would like a CB when everything is ready  Fax# [phone]

## 2019-10-24 NOTE — Telephone Encounter (Signed)
Please advise 

## 2019-10-25 NOTE — Telephone Encounter (Signed)
I called discussed. Sent message to have my note resent to [facility] which has plan .

## 2019-11-03 ENCOUNTER — Telehealth: Payer: Self-pay | Admitting: Family Medicine

## 2019-11-03 NOTE — Telephone Encounter (Signed)
Received call from Karl Ito with [facility] advised they are considering sponsoring treatment for the patient. Velna Hatchet said she need to know details concerning the epidural treatments and how much the treatment  cost. Velna Hatchet said she have a few more questions. The number to contact Velna Hatchet is [phone]

## 2019-11-09 NOTE — Telephone Encounter (Signed)
Called and left voice mail: wanted to find out what other questions she has, so as to call her back with all answers (including the cost of an ESI), if possible.

## 2019-11-10 NOTE — Telephone Encounter (Signed)
This is the patient I asked you about yesterday, and you said you had already talked with Velna Hatchet about this. Drinda Butts said the cost for this specific ESI [phone]) is $795.00. May I please pass this to you, as any other questions Velna Hatchet has is probably about this ESI. Btw, Dr. Ophelia Charter was the ordering physician (Dr. Prince Rome saw him for something else).

## 2019-11-10 NOTE — Telephone Encounter (Signed)
Called Sheila #2 and ivm #1. I will try again later.

## 2019-11-10 NOTE — Telephone Encounter (Signed)
Called Velna Hatchet and ivm for her to return my call. Thanks

## 2019-11-13 NOTE — Telephone Encounter (Signed)
Called Velna Hatchet and ivm# 2.

## 2019-11-14 NOTE — Telephone Encounter (Signed)
Called Blake Hampton and she requesting for treatment plan for an Auth# for [facility].

## 2019-11-14 NOTE — Telephone Encounter (Signed)
Felicia with [facility] would like a copy of a back up treatment plan as well as the original plan to be faxed over so they can move on from this pt   Fax# [phone]

## 2019-11-17 ENCOUNTER — Encounter: Payer: Self-pay | Admitting: Physical Medicine and Rehabilitation

## 2019-11-17 NOTE — Telephone Encounter (Signed)
The plan is for a left L5 transforaminal epidural steroid injection. This is based on MRI findings of degenerative L5-S1 central and by foraminal protrusion really more left than right with subarticular narrowing. The injection would be diagnostic and hopefully therapeutic. This would be done with fluoroscopic guidance. If he gets good relief diagnostically with no this is what is causing his pain and therapeutically if it did last for a good length of time it could be repeated as a treatment plan thereafter. There is not a set number of injections we do want to me monitor the outcome. If he were to get some relief but just not as much as he would like then we may consider a second injection close together.

## 2019-11-17 NOTE — Telephone Encounter (Signed)
Please advise. Patient referred for left L5 TF.

## 2019-12-20 ENCOUNTER — Ambulatory Visit: Payer: PRIVATE HEALTH INSURANCE | Admitting: Physical Medicine and Rehabilitation

## 2019-12-21 ENCOUNTER — Ambulatory Visit: Payer: PRIVATE HEALTH INSURANCE | Admitting: Rehabilitative and Restorative Service Providers"

## 2020-01-08 ENCOUNTER — Ambulatory Visit (INDEPENDENT_AMBULATORY_CARE_PROVIDER_SITE_OTHER): Payer: PRIVATE HEALTH INSURANCE | Admitting: Physical Medicine and Rehabilitation

## 2020-01-08 ENCOUNTER — Other Ambulatory Visit: Payer: Self-pay

## 2020-01-08 ENCOUNTER — Ambulatory Visit: Payer: Self-pay

## 2020-01-08 ENCOUNTER — Encounter: Payer: Self-pay | Admitting: Physical Medicine and Rehabilitation

## 2020-01-08 VITALS — BP 144/100 | HR 124

## 2020-01-08 DIAGNOSIS — M47816 Spondylosis without myelopathy or radiculopathy, lumbar region: Secondary | ICD-10-CM

## 2020-01-08 DIAGNOSIS — M542 Cervicalgia: Secondary | ICD-10-CM | POA: Diagnosis not present

## 2020-01-08 DIAGNOSIS — R296 Repeated falls: Secondary | ICD-10-CM

## 2020-01-08 DIAGNOSIS — M5116 Intervertebral disc disorders with radiculopathy, lumbar region: Secondary | ICD-10-CM | POA: Diagnosis not present

## 2020-01-08 MED ORDER — METHYLPREDNISOLONE ACETATE 80 MG/ML IJ SUSP: 80.0000 mg | Freq: Once | INTRAMUSCULAR | Status: DC

## 2020-01-08 NOTE — Progress Notes (Addendum)
Blake Hampton - 44 y.o. male MRN [id number]  Date of birth: 1975/12/19  Office Visit Note: Visit Date: 01/08/2020 PCP: Dayna Barker (Inactive) Referred by: Lavada Mesi, MD  Subjective: Chief Complaint  Patient presents with   Lower Back - Pain   HPI: Blake Hampton is a 44 y.o. male who comes in today For planned L5 epidural injection as requested by Dr. Lavada Mesi.  Patient is seen both Dr. Prince Rome and Dr. Annell Greening.  Those notes are reviewed.  Patient has history with them of back pain with worsening right hip and leg pain sometimes left-sided pain.  Patient is using crutches now for ambulation ambulates very slowly.  With talking with him he is very concerned about the fact that he has had multiple falls.  He had a fall back in August before he saw Dr. Ophelia Charter for his cervical spine.  He is having more issues with multiple falls had a recent fall in October.  He is now having more pain in the neck and arms overall the same as he had with Dr. Ophelia Charter.  He has not had any new focal weakness of the lower extremities.  He does report some pain into the groin area as well as new onset of potential erectile dysfunction. Has had no bowel or bladder issues.  Review of Systems  Musculoskeletal: Positive for back pain, joint pain and neck pain.  Neurological: Positive for tingling.  All other systems reviewed and are negative.  Otherwise per HPI.  Assessment & Plan: Visit Diagnoses:  1. Radiculopathy due to lumbar intervertebral disc disorder   2. Spondylosis without myelopathy or radiculopathy, lumbar region   3. Cervicalgia   4. Falls frequently     Plan: Findings:  Patient does not want to do the epidural today because he is unsure really at this point what to do next in terms of his pain and potential for physical therapy and the multiple falls.  We will have him follow-up with Dr. Annell Greening again to see if they can get a game plan for his multiple falls as well as neck pain and  leg pain.  I am happy to do the epidural injection of his lumbar spine should he want to do that after he follows up with Dr. Kevan Ny.  We will get an appointment with him today for    Meds & Orders:  Meds ordered this encounter  Medications   DISCONTD: methylPREDNISolone acetate (DEPO-MEDROL) injection 80 mg    No orders of the defined types were placed in this encounter.   Follow-up: Return if symptoms worsen or fail to improve.   Procedures: No procedures performed  No notes on file   Clinical History: Lumbar radiculopathy. Left leg pain and weakness with numbness.  EXAM: MRI LUMBAR SPINE WITHOUT CONTRAST  TECHNIQUE: Multiplanar, multisequence MR imaging of the lumbar spine was performed. No intravenous contrast was administered.  COMPARISON:  06/27/2009  FINDINGS: Segmentation:  Standard lumbar numbering  Alignment:  Normal  Vertebrae:  No fracture, evidence of discitis, or bone lesion.  Conus medullaris and cauda equina: Conus extends to the L1-2 level. Conus and cauda equina appear normal.  Paraspinal and other soft tissues: Negative  Disc levels:  Degenerative changes are superimposed on a congenitally narrow canal from short pedicles.  T12- L1: Unremarkable.  L1-L2: Unremarkable.  L2-L3: Minor disc narrowing and bulging since prior.  No impingement  L3-L4: Disc narrowing and mild bulging with posterior annular fissure and tiny central protrusion,  essentially stable. No degenerative impingement  L4-L5: Disc narrowing with right paracentral protrusion at an annular fissure. The herniation is mildly increased from before but is noncompressive. Negative facets and patent foramina  L5-S1:Greatest level of degenerative disc narrowing which has progressed. Chronic central and biforaminal protrusions. Asymmetric left subarticular recess narrowing and posterior S1 nerve root displacement. A left foraminal herniation contacts the left L5  nerve root with moderate foraminal stenosis.  IMPRESSION: 1. Disc degeneration primarily at L3-4 and below with mild progression given long interval follow-up (2011). 2. L5-S1 moderate left foraminal stenosis and asymmetric left subarticular recess narrowing. 3. Diffusely patent thecal sac.   Electronically Signed   By: Marnee Spring M.D.   On: 09/23/2019 16:34   He has no history on file for tobacco use. No results for input(s): HGBA1C, LABURIC in the last 8760 hours.  Objective:  VS:  HT:     WT:    BMI:      BP:(!) 144/100   HR:(!) 124bpm   TEMP: ( )   RESP:  Physical Exam Vitals and nursing note reviewed.  Constitutional:      General: He is not in acute distress.    Appearance: Normal appearance. He is well-developed. He is obese.  HENT:     Head: Normocephalic and atraumatic.  Eyes:     Conjunctiva/sclera: Conjunctivae normal.     Pupils: Pupils are equal, round, and reactive to light.  Cardiovascular:     Rate and Rhythm: Normal rate.     Pulses: Normal pulses.     Heart sounds: Normal heart sounds.  Pulmonary:     Effort: Pulmonary effort is normal. No respiratory distress.  Musculoskeletal:        General: Tenderness present.     Cervical back: Neck supple. No rigidity.     Right lower leg: No edema.     Left lower leg: No edema.     Comments: Patient is very slow to rise from a seated position to full extension does have pain with facet loading.  He has no pain with hip rotation.  He has good distal strength.  Ambulates with crutches and a very slow pace.  Skin:    General: Skin is warm and dry.     Findings: No erythema or rash.  Neurological:     General: No focal deficit present.     Mental Status: He is alert and oriented to person, place, and time.     Sensory: No sensory deficit.     Motor: No weakness.     Coordination: Coordination normal.     Gait: Gait abnormal.  Psychiatric:        Mood and Affect: Mood normal.        Behavior: Behavior  normal.     Ortho Exam  Imaging: No results found.  Past Medical/Family/Surgical/Social History: Medications & Allergies reviewed per EMR, new medications updated. There are no problems to display for this patient.  Past Medical History:  Diagnosis Date   Back pain    Depression    Hypertension    History reviewed. No pertinent family history. History reviewed. No pertinent surgical history. Social History   Occupational History   Not on file  Tobacco Use   Smoking status: Not on file  Substance and Sexual Activity   Alcohol use: Not on file   Drug use: Not on file   Sexual activity: Not on file

## 2020-01-08 NOTE — Progress Notes (Signed)
Pt state lower back pain. Pt states standing, walking and bending over makes the pain worse. Pt state coughing, sneezing and stress makes the pain worse. Pt state he takes pain meds to ease the pain. Pt state he has falling in the month of Aug 2021, that has cause new issue with his neck, head and hands.  Numeric Pain Rating Scale and Functional Assessment Average Pain 8   In the last MONTH (on 0-10 scale) has pain interfered with the following?  1. General activity like being  able to carry out your everyday physical activities such as walking, climbing stairs, carrying groceries, or moving a chair?  Rating(10)   +Driver, -BT, -Dye Allergies.

## 2020-01-10 ENCOUNTER — Ambulatory Visit: Payer: PRIVATE HEALTH INSURANCE | Admitting: Rehabilitative and Restorative Service Providers"

## 2020-01-15 ENCOUNTER — Other Ambulatory Visit: Payer: Self-pay

## 2020-01-15 ENCOUNTER — Encounter: Payer: Self-pay | Admitting: Family Medicine

## 2020-01-15 ENCOUNTER — Ambulatory Visit (INDEPENDENT_AMBULATORY_CARE_PROVIDER_SITE_OTHER): Payer: PRIVATE HEALTH INSURANCE | Admitting: Family Medicine

## 2020-01-15 DIAGNOSIS — G8929 Other chronic pain: Secondary | ICD-10-CM

## 2020-01-15 DIAGNOSIS — M542 Cervicalgia: Secondary | ICD-10-CM

## 2020-01-15 DIAGNOSIS — M25511 Pain in right shoulder: Secondary | ICD-10-CM

## 2020-01-15 DIAGNOSIS — M545 Low back pain, unspecified: Secondary | ICD-10-CM

## 2020-01-15 NOTE — Progress Notes (Signed)
   Office Visit Note   Patient: Blake Hampton           Date of Birth: Jul 08, 1975           MRN: [id number] Visit Date: 01/15/2020 Requested by: No referring provider defined for this encounter. PCP: Luna Kitchens (Inactive)  Subjective: Chief Complaint  Patient presents with  . Lower Back - Pain, Follow-up    Did not have ESI on 01/08/20 - see Dr. Romona Curls notes.  . pain bilateral upper extremities - follow up    HPI: He is here for follow-up neck pain, right shoulder and low back pain.  Since last visit he met with Dr. Ernestina Patches and was not sure he wanted to proceed with epidural injection, he did not think he would be able to proceed with physical therapy afterward.  He states that his neck and right shoulder continue to bother him significantly.  He is concerned about the possibility that he might of injured his brain and wants an MRI scan of his brain.  He still complains of instability, he walks with the use of bilateral crutches.  He continues to complain of low back and leg pain.              ROS:   All other systems were reviewed and are negative.  Objective: Vital Signs: There were no vitals taken for this visit.  Physical Exam:  General:  Alert and oriented, in no acute distress. Pulm:  Breathing unlabored. Psy:  Normal mood, congruent affect.  Neck: He has pain at the extremes of rotation bilaterally. Right shoulder: He is able to raise his arm overhead.  He is able to resist with empty can test but he has give way weakness.  Remainder of upper extremity strength is normal. Low back: He has pain with straight leg raise.  He has good strength with ankle dorsiflexion, eversion, and plantar flexion bilaterally.  Imaging: No results found.  Assessment & Plan: 1.  Persistent neck pain -MRI to evaluate.  Depending on the results, consider neurosurgical consult.  2.  Persistent right shoulder pain -MRI to rule out rotator cuff tear.  3.  Chronic low back pain -We will  consider epidural steroid injection referral, but first we will await MRI results.     Procedures: No procedures performed  No notes on file     PMFS History: There are no problems to display for this patient.  Past Medical History:  Diagnosis Date  . Back pain   . Depression   . Hypertension     History reviewed. No pertinent family history.  History reviewed. No pertinent surgical history. Social History   Occupational History  . Not on file  Tobacco Use  . Smoking status: Not on file  Substance and Sexual Activity  . Alcohol use: Not on file  . Drug use: Not on file  . Sexual activity: Not on file

## 2020-01-23 ENCOUNTER — Telehealth: Payer: Self-pay | Admitting: Physical Medicine and Rehabilitation

## 2020-01-23 NOTE — Telephone Encounter (Signed)
Received call from [facility] with [facility] needing a call back concerning a [facility] form she received for the patient.  The number to contact Sunny Schlein is            [phone]

## 2020-01-24 ENCOUNTER — Telehealth: Payer: Self-pay | Admitting: Family Medicine

## 2020-01-24 DIAGNOSIS — G8929 Other chronic pain: Secondary | ICD-10-CM

## 2020-01-24 DIAGNOSIS — M545 Low back pain, unspecified: Secondary | ICD-10-CM

## 2020-01-24 NOTE — Telephone Encounter (Signed)
I called and left this info on the voice mail.

## 2020-01-24 NOTE — Telephone Encounter (Signed)
Pt called stating he had discussed getting an epidural injection and then starting PT with vocational rehab after that; but the pt states PT has been trying to get him scheduled and he hasn't had the epidural yet. Pt would like a CB in regards to wether the epidural is still part of the plan. Pt did mention he may have been exposed to covid so when Dr. Prince Rome calls back he would like to discuss that as well  [phone]

## 2020-01-24 NOTE — Telephone Encounter (Signed)
Please advise 

## 2020-01-24 NOTE — Telephone Encounter (Signed)
At his visit we discussed waiting for other MRI results before getting ESI.  But I will go ahead and order ESI since that's what he wants.  He can put PT on hold until after ESI.  He can go to urgent care for covid concerns.

## 2020-01-29 ENCOUNTER — Ambulatory Visit: Payer: PRIVATE HEALTH INSURANCE | Admitting: Physical Therapy

## 2020-02-02 ENCOUNTER — Ambulatory Visit
Admission: EM | Admit: 2020-02-02 | Discharge: 2020-02-02 | Disposition: A | Discharge status: Home / Self Care | Payer: PRIVATE HEALTH INSURANCE

## 2020-02-02 DIAGNOSIS — M549 Dorsalgia, unspecified: Secondary | ICD-10-CM

## 2020-02-02 DIAGNOSIS — R209 Unspecified disturbances of skin sensation: Secondary | ICD-10-CM

## 2020-02-02 NOTE — ED Triage Notes (Signed)
Pt c/o hoarse voice, no smell, decrease taste, cough, and nasal congestion since 11/25, had a neg covid test on 11/29.  Pt states hx of chronic back pain. States his coughing has caused his back pain worse and made him fall in his bathroom and hit the tub with lower back pain.

## 2020-02-02 NOTE — ED Provider Notes (Signed)
[facility]    CSN: [id number] Arrival date & time: 02/02/20  1611      History   Chief Complaint Chief Complaint  Patient presents with  . Cough  . Fall    HPI Blake Hampton is a 44 y.o. male.   HPI Patient with a history of degenerative cervical, lumbar, thoracic spine disease presents for back pain and worsening cough. Patient reports recurrent falls related to generalized leg weakness and numbness. He is unable to bear weight and is in a wheelchair today.  Provider entered the room after hearing patient hitting the door repeatedly with cane. Patient reported he is unable to feel anything from his waist down and is having severe mid to low back pain and symptoms have worsened since his arrival at [facility]. Past Medical History:  Diagnosis Date  . Back pain   . Depression   . Hypertension     There are no problems to display for this patient.   History reviewed. No pertinent surgical history.     Home Medications    Prior to Admission medications   Medication Sig Start Date End Date Taking? Authorizing Provider  acetaminophen (TYLENOL) 325 MG tablet Take 650 mg by mouth every 6 (six) hours as needed.    [provider]  metaxalone (SKELAXIN) 800 MG tablet Take 800 mg by mouth 3 (three) times daily.     [provider]  oxyCODONE-acetaminophen (PERCOCET/ROXICET) 5-325 MG tablet Take 1 tablet by mouth every 6 (six) hours as needed. 03/09/18   Eyvonne Mechanic, PA-C    Family History History reviewed. No pertinent family history.  Social History Social History   Tobacco Use  . Smoking status: Current Every Day Smoker    Types: Cigarettes  . Smokeless tobacco: Never Used  Substance Use Topics  . Alcohol use: Not Currently  . Drug use: Not Currently     Allergies   Vioxx [rofecoxib]   Review of Systems Review of Systems Pertinent negatives listed in HPI   Physical Exam Triage Vital Signs ED Triage Vitals  Enc Vitals  Group     BP 02/02/20 1639 117/84     Pulse Rate 02/02/20 1639 (!) 109     Resp 02/02/20 1639 18     Temp 02/02/20 1639 98.9 F (37.2 C)     Temp Source 02/02/20 1639 Oral     SpO2 02/02/20 1639 98 %     Weight --      Height --      Head Circumference --      Peak Flow --      Pain Score 02/02/20 1640 3     Pain Loc --      Pain Edu? --      Excl. in GC? --    No data found.  Updated Vital Signs BP 117/84 (BP Location: Left Arm)   Pulse (!) 109   Temp 98.9 F (37.2 C) (Oral)   Resp 18   SpO2 98%   Visual Acuity Right Eye Distance:   Left Eye Distance:   Bilateral Distance:    Right Eye Near:   Left Eye Near:    Bilateral Near:     Physical Exam Constitutional:      Appearance: He is ill-appearing.  Cardiovascular:     Rate and Rhythm: Tachycardia present.  Pulmonary:     Effort: Pulmonary effort is normal.  Skin:    Capillary Refill: Capillary refill takes less than 2 seconds.  Neurological:     Motor: Weakness present.     Coordination: Coordination abnormal.     Gait: Gait abnormal.  Psychiatric:        Mood and Affect: Mood normal.     UC Treatments / Results  Labs (all labs ordered are listed, but only abnormal results are displayed) Labs Reviewed - No data to display  EKG   Radiology No results found.  Procedures Procedures (including critical care time)  Medications Ordered in UC Medications - No data to display  Initial Impression / Assessment and Plan / UC Course  I have reviewed the triage vital signs and the nursing notes.  Pertinent labs & imaging results that were available during my care of the patient were reviewed by me and considered in my medical decision making (see chart for details).     Given acute onset of worsening neurological symptoms related to back pain. Patient directed to ER via personal vehicle and is accompanied by family. P Final Clinical Impressions(s) / UC Diagnoses   Final diagnoses:  Severe back  pain  Loss of sensation of saddle area and both lower extremities   Discharge Instructions   None    ED Prescriptions    None     PDMP not reviewed this encounter.   Bing Neighbors, [place] 02/07/20 2042

## 2020-02-02 NOTE — ED Notes (Signed)
Pt states can't feel his rt leg now x41minutes. Per PA pt referred to ED for further evaluation. pts mother drove him here. Pt able to stand and get into car with assistance.

## 2020-02-03 ENCOUNTER — Encounter (HOSPITAL_COMMUNITY): Payer: Self-pay | Admitting: Emergency Medicine

## 2020-02-03 ENCOUNTER — Emergency Department (HOSPITAL_COMMUNITY)
Admission: EM | Admit: 2020-02-03 | Discharge: 2020-02-03 | Disposition: A | Discharge status: Home / Self Care | Payer: HRSA Program | Attending: Emergency Medicine | Admitting: Emergency Medicine

## 2020-02-03 ENCOUNTER — Emergency Department (HOSPITAL_COMMUNITY): Payer: HRSA Program

## 2020-02-03 ENCOUNTER — Other Ambulatory Visit: Payer: Self-pay

## 2020-02-03 DIAGNOSIS — M545 Low back pain, unspecified: Secondary | ICD-10-CM | POA: Diagnosis not present

## 2020-02-03 DIAGNOSIS — I10 Essential (primary) hypertension: Secondary | ICD-10-CM | POA: Diagnosis not present

## 2020-02-03 DIAGNOSIS — U071 COVID-19: Secondary | ICD-10-CM | POA: Insufficient documentation

## 2020-02-03 DIAGNOSIS — G8929 Other chronic pain: Secondary | ICD-10-CM | POA: Insufficient documentation

## 2020-02-03 DIAGNOSIS — R0602 Shortness of breath: Secondary | ICD-10-CM | POA: Diagnosis present

## 2020-02-03 DIAGNOSIS — F17210 Nicotine dependence, cigarettes, uncomplicated: Secondary | ICD-10-CM | POA: Diagnosis not present

## 2020-02-03 LAB — BASIC METABOLIC PANEL
Anion gap: 12 (ref 5–15)
BUN: 10 mg/dL (ref 6–20)
CO2: 23 mmol/L (ref 22–32)
Calcium: 9.6 mg/dL (ref 8.9–10.3)
Chloride: 102 mmol/L (ref 98–111)
Creatinine, Ser: 1.11 mg/dL (ref 0.61–1.24)
GFR, Estimated: >60 mL/min (ref >60)
Glucose, Bld: 113 mg/dL — ABNORMAL HIGH (ref 70–99)
Potassium: 4 mmol/L (ref 3.5–5.1)
Sodium: 137 mmol/L (ref 135–145)

## 2020-02-03 LAB — CBC
HCT: 43.9 % (ref 39.0–52.0)
Hemoglobin: 14.7 g/dL (ref 13.0–17.0)
MCH: 28.7 pg (ref 26.0–34.0)
MCHC: 33.5 g/dL (ref 30.0–36.0)
MCV: 85.6 fL (ref 80.0–100.0)
Platelets: 313 10*3/uL (ref 150–400)
RBC: 5.13 MIL/uL (ref 4.22–5.81)
RDW: 13.5 % (ref 11.5–15.5)
WBC: 6.8 10*3/uL (ref 4.0–10.5)
nRBC: 0 % (ref 0.0–0.2)

## 2020-02-03 LAB — RESP PANEL BY RT-PCR (FLU A&B, COVID) ARPGX2
Influenza A by PCR: NEGATIVE
Influenza B by PCR: NEGATIVE
SARS Coronavirus 2 by RT PCR: POSITIVE — AB

## 2020-02-03 MED ORDER — LIDOCAINE 5 % EX PTCH
1.0000 | MEDICATED_PATCH | Freq: Once | CUTANEOUS | Status: DC
  Administered 2020-02-03: 1 via TRANSDERMAL
  Filled 2020-02-03: qty 1

## 2020-02-03 MED ORDER — DIAZEPAM 5 MG PO TABS
5.0000 mg | ORAL_TABLET | Freq: Once | ORAL | Status: AC
  Administered 2020-02-03: 5 mg via ORAL
  Filled 2020-02-03: qty 1

## 2020-02-03 MED ORDER — METAXALONE 800 MG PO TABS: 800.0000 mg | ORAL_TABLET | Freq: Three times a day (TID) | ORAL | 0 refills | Status: DC | PRN

## 2020-02-03 MED ORDER — BENZONATATE 100 MG PO CAPS: 100.0000 mg | ORAL_CAPSULE | Freq: Three times a day (TID) | ORAL | 0 refills | Status: AC | PRN

## 2020-02-03 NOTE — ED Provider Notes (Signed)
[facility] Provider Note   CSN: [id number] Arrival date & time: 02/03/20  1406     History Chief Complaint  Patient presents with  . Shortness of Breath  . Emesis  . Cough    Wallace CullensBrandon J Urey is a 44 y.o. male with past medical history significant for back pain, depression, hypertension. Had COVID vaccinations.  HPI Patient presents to emergency department today with chief complaint of shortness of breath, emesis and cough x 9 days. He had a covid exposure 11/19. He has had 2 negative covid tests since then , on 11/22 and 11/29. He is endorsing sore throat, nonproductive cough, and loss of sense of taste and smell. He states he feels short of breath after a coughing fit. He is concerned about covid as he lives with his elderly mother. He is requesting a covid test.He has been taking tylenol at home and taking lozenges.  Patient also reports having back pain. 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 He fell in the shower x9 days ago. He denies hitting his head or LOC. He states he fell and landed on his right side. He feels like his back pain has worsened since the fall. He describes pain as aching. Pain is on right side of lower back and radiates down right leg. Pain is intermittent. He rates pain 6/10 in severity.  Denies fevers, weight loss, numbness/weakness of upper and lower extremities, bowel/bladder incontinence, urinary retention, history of cancer, saddle anesthesia, history of back surgery, history of IVDA.     Past Medical History:  Diagnosis Date  . Back pain   . Depression   . Hypertension     There are no problems to display for this patient.   History reviewed. No pertinent surgical history.     No family history on file.  Social History   Tobacco Use  . Smoking status: Current Every Day Smoker    Types: Cigarettes  . Smokeless tobacco: Never Used  Substance Use Topics  . Alcohol use: Not Currently   . Drug use: Not Currently    Home Medications Prior to Admission medications   Medication Sig Start Date End Date Taking? Authorizing Provider  acetaminophen (TYLENOL) 325 MG tablet Take 650 mg by mouth every 6 (six) hours as needed.    [provider]  benzonatate (TESSALON) 100 MG capsule Take 1 capsule (100 mg total) by mouth every 8 (eight) hours as needed for cough. 02/03/20   Walisiewicz, Caroleen HammanKaitlyn E, PA-C  metaxalone (SKELAXIN) 800 MG tablet Take 1 tablet (800 mg total) by mouth 3 (three) times daily as needed for muscle spasms. 02/03/20   Shanon AceWalisiewicz, Eliam Snapp E, PA-C    Allergies    Vioxx [rofecoxib]  Review of Systems   Review of Systems All other systems are reviewed and are negative for acute change except as noted in the HPI.  Physical Exam Updated Vital Signs BP 130/80 (BP Location: Right Arm)   Pulse 85   Temp 98 F (36.7 C) (Oral)   Resp 18   Ht 5\' 11"  (1.803 m)   Wt 108.9 kg   SpO2 97%   BMI 33.47 kg/m   Physical Exam Vitals and nursing note reviewed.  Constitutional:      General: He is not in acute distress.    Appearance: He is not ill-appearing.  HENT:     Head: Normocephalic and atraumatic.     Right Ear: Tympanic membrane and external ear  normal.     Left Ear: Tympanic membrane and external ear normal.     Nose: Nose normal.     Mouth/Throat:     Mouth: Mucous membranes are moist.     Pharynx: Oropharynx is clear.     Comments: Minor erythema to oropharynx, no edema, no exudate, no tonsillar swelling, voice normal, neck supple without lymphadenopathy  Eyes:     General: No scleral icterus.       Right eye: No discharge.        Left eye: No discharge.     Extraocular Movements: Extraocular movements intact.     Conjunctiva/sclera: Conjunctivae normal.     Pupils: Pupils are equal, round, and reactive to light.  Neck:     Vascular: No JVD.  Cardiovascular:     Rate and Rhythm: Normal rate and regular rhythm.     Pulses: Normal  pulses.          Radial pulses are 2+ on the right side and 2+ on the left side.     Heart sounds: Normal heart sounds.  Pulmonary:     Comments: Lungs clear to auscultation in all fields. Symmetric chest rise. No wheezing, rales, or rhonchi.  Normal work of breathing.  Talking full sentences.  Oxygen saturation is 98% on room air during exam. Abdominal:     Comments: Abdomen is soft, non-distended, and non-tender in all quadrants. No rigidity, no guarding. No peritoneal signs.  Musculoskeletal:        General: Normal range of motion.     Cervical back: Normal range of motion.       Back:     Comments: Tenderness to palpation as depicted in image above.  No overlying skin changes.  No midline tenderness.  No step-off, deformity or crepitus.  Skin:    General: Skin is warm and dry.     Capillary Refill: Capillary refill takes less than 2 seconds.  Neurological:     Mental Status: He is oriented to person, place, and time.     GCS: GCS eye subscore is 4. GCS verbal subscore is 5. GCS motor subscore is 6.     Comments: Fluent speech, no facial droop.  Sensation grossly intact to light touch in the lower extremities bilaterally. No saddle anesthesias. Strength 5/5 with flexion and extension at the bilateral hips, knees, and ankles.    Psychiatric:        Behavior: Behavior normal.     ED Results / Procedures / Treatments   Labs (all labs ordered are listed, but only abnormal results are displayed) Labs Reviewed  RESP PANEL BY RT-PCR (FLU A&B, COVID) ARPGX2 - Abnormal; Notable for the following components:      Result Value   SARS Coronavirus 2 by RT PCR POSITIVE (*)    All other components within normal limits  BASIC METABOLIC PANEL - Abnormal; Notable for the following components:   Glucose, Bld 113 (*)    All other components within normal limits  CBC    EKG EKG Interpretation  Date/Time:  Saturday February 03 2020 21:02:58 EST Ventricular Rate:  84 PR Interval:    QRS  Duration: 96 QT Interval:  355 QTC Calculation: 420 R Axis:   52 Text Interpretation: Sinus or ectopic atrial rhythm Probable left atrial enlargement Anteroseptal infarct, old Borderline T abnormalities, inferior leads No STEMI Confirmed by Alona Bene [id number]) on 02/03/2020 9:08:12 PM   Radiology DG Thoracic Spine 2 View  Result Date: 02/03/2020 CLINICAL  DATA:  Back pain EXAM: THORACIC SPINE 2 VIEWS; LUMBAR SPINE - COMPLETE 4+ VIEW COMPARISON:  MR lumbar spine 09/22/2019, lumbar radiograph 03/09/2018 FINDINGS: Twelve rib-bearing thoracic levels. Five non-rib-bearing lumbar levels. No acute fracture or traumatic listhesis is seen of the thoracic spine. A mild dextrocurvature of the lumbar levels is favored to be positional. No acute fracture vertebral body height loss is seen in the lumbar levels. A slight stepwise retrolisthesis L1-L4 is unchanged from comparison imaging. No acute traumatic listhesis. No discernible pars defect. Slightly progressive degenerative change at the L5-S1 level. Included portions of the chest and mediastinum unremarkable. Bowel gas pattern is normal. Visible bones of the pelvis are intact and congruent. Remaining soft tissues are free of acute abnormality. IMPRESSION: 1. No acute osseous abnormality. 2. Stable minimal stepwise retrolisthesis L1-L4. 3. Degenerative changes in the lumbar levels, maximal L5-S1. 4. Mild dextrocurvature of the lumbar spine is favored to be positional. Electronically Signed   By: Kreg Shropshire M.D.   On: 02/03/2020 22:22   DG Lumbar Spine Complete  Result Date: 02/03/2020 CLINICAL DATA:  Back pain EXAM: THORACIC SPINE 2 VIEWS; LUMBAR SPINE - COMPLETE 4+ VIEW COMPARISON:  MR lumbar spine 09/22/2019, lumbar radiograph 03/09/2018 FINDINGS: Twelve rib-bearing thoracic levels. Five non-rib-bearing lumbar levels. No acute fracture or traumatic listhesis is seen of the thoracic spine. A mild dextrocurvature of the lumbar levels is favored to be positional.  No acute fracture vertebral body height loss is seen in the lumbar levels. A slight stepwise retrolisthesis L1-L4 is unchanged from comparison imaging. No acute traumatic listhesis. No discernible pars defect. Slightly progressive degenerative change at the L5-S1 level. Included portions of the chest and mediastinum unremarkable. Bowel gas pattern is normal. Visible bones of the pelvis are intact and congruent. Remaining soft tissues are free of acute abnormality. IMPRESSION: 1. No acute osseous abnormality. 2. Stable minimal stepwise retrolisthesis L1-L4. 3. Degenerative changes in the lumbar levels, maximal L5-S1. 4. Mild dextrocurvature of the lumbar spine is favored to be positional. Electronically Signed   By: Kreg Shropshire M.D.   On: 02/03/2020 22:22   DG Chest Portable 1 View  Result Date: 02/03/2020 CLINICAL DATA:  Shortness of breath and chest pain, COVID-19 positivity EXAM: PORTABLE CHEST 1 VIEW COMPARISON:  None. FINDINGS: The heart size and mediastinal contours are within normal limits. Both lungs are clear. The visualized skeletal structures are unremarkable. IMPRESSION: No active disease. Electronically Signed   By: Alcide Clever M.D.   On: 02/03/2020 17:16    Procedures Procedures (including critical care time)  Medications Ordered in ED Medications  lidocaine (LIDODERM) 5 % 1 patch (1 patch Transdermal Patch Applied 02/03/20 2217)  diazepam (VALIUM) tablet 5 mg (5 mg Oral Given 02/03/20 2217)    ED Course  I have reviewed the triage vital signs and the nursing notes.  Pertinent labs & imaging results that were available during my care of the patient were reviewed by me and considered in my medical decision making (see chart for details).    MDM Rules/Calculators/A&P                          History provided by patient with additional history obtained from chart review.    44 yo male presenting with URI symptoms and back pain. VS in triage show he is afebrile, normotensive,  tachycardic to 105.  Patient is very well-appearing, in no acute distress. On my exam HR ranging 80-92.  He has tenderness  palpation of right lumbar paraspinal muscles.  No midline tenderness.  No overlying skin changes.  Lower extremity neuro exam is normal, no saddle anesthesia.  No findings to suggest cauda equina. Covid test today is positive.  CBC and BMP are unremarkable.  EKG without ischemic changes.  I viewed pt's chest xray and it does not suggest acute infectious processes.  X-rays of thoracic and lumbar spine without any acute findings, no fractures or dislocations.  Patient given lidocaine patch and Valium.  On reassessment he states pain has improved.  Engaged in shared decision-making with patient.  He feels that he can manage his symptoms at home.  He is aware you need to quarantine for 10 days even though he is likely in the end of his disease course as he has had symptoms for 9 days.  Prescription sent for muscle relaxer for his back pain.  Recommend he follow-up with the post Covid clinic.  He does not have a PCP, given information for community resources.  The patient appears reasonably screened and/or stabilized for discharge and I doubt any other medical condition or other [facility] requiring further screening, evaluation, or treatment in the ED at this time prior to discharge. The patient is safe for discharge with strict return precautions discussed.   KAELEM BRACH was evaluated in Emergency Department on 02/03/2020 for the symptoms described in the history of present illness. He was evaluated in the context of the global COVID-19 pandemic, which necessitated consideration that the patient might be at risk for infection with the SARS-CoV-2 virus that causes COVID-19. Institutional protocols and algorithms that pertain to the evaluation of patients at risk for COVID-19 are in a state of rapid change based on information released by regulatory bodies including the CDC and federal and state  organizations. These policies and algorithms were followed during the patient's care in the ED.   Portions of this note were generated with Scientist, clinical (histocompatibility and immunogenetics). Dictation errors may occur despite best attempts at proofreading.    Final Clinical Impression(s) / ED Diagnoses Final diagnoses:  Chronic right-sided low back pain without sciatica  COVID    Rx / DC Orders ED Discharge Orders         Ordered    metaxalone (SKELAXIN) 800 MG tablet  3 times daily PRN        02/03/20 2313    benzonatate (TESSALON) 100 MG capsule  Every 8 hours PRN        02/03/20 2313           Shanon Ace, PA-C 02/03/20 2344    Maia Plan, MD 02/04/20 1352

## 2020-02-03 NOTE — Discharge Instructions (Addendum)
Thank you for allowing [place] to care for you today.   Prescription sent to your pharmacy for Skelaxin, take if needed. It can make you drowsy, do not drive or work while taking  Prescription sent for [employer]. This is a cough medicine. Take if needed.   Please return to the emergency department if you have any new or worsening symptoms.  You tested positive for covid-19 today.   Medications- You can take medications to help treat your symptoms: -Tylenol for fever and body aches. Please take as prescribed on the bottle. -Over the coutner cough medicine such as mucinex, robitussin, or other brands. -Flonase or saline nasal spray for nasal congestion -Vitamins as recommended by CDC  Treatment- This is a virus and unfortunately there are no antibitotics approved to treat this virus at this time. It is important to monitor your symptoms closely: -You should have a theremometer at home to check your temperature when feeling feverish. -Use a pulse ox meter to measure your oxygen when feeling short of breath.  -If your fever is over 100.4 despite taking tylenol or if your oxygen level drops below 94% these are reasons to return to the emergency department for further evaluation.   -You need to quarantine for 10 days starting today.  You can return to work, school or normal activities if on day 10 you are fever free without the use of Tylenol or ibuprofen.  You will need to continue quarantine if you still have a fever over 100.4.  Again: symptoms of shortness of breath, chest pain, difficulty breathing, new onset of confusion, any symptoms that are concerning. If any of these symptoms you should come to emergency department for evaluation.   I hope you feel better soon

## 2020-02-03 NOTE — ED Notes (Signed)
Discharge instructions provided. Verbalized understanding. Alert and oriented. Escorted out of ED via w/c.

## 2020-02-03 NOTE — ED Triage Notes (Signed)
Pt reports exposed to COVID on 11/19.  Tested negative for COVID on 11/22 and 11/29.  C/o SOB, back pain, sore throat, headache, loss of taste, loss of smell, "coughing fits", and fatigue.  States he was seen at 2 urgent cares yesterday and told to come to ED but states he was too tired to come.  Vomited this morning.

## 2020-02-03 NOTE — ED Notes (Signed)
Morrie Sheldon, NT, observed this patient with labored breathing and diaphoretic. Vitals obtained and reported to [place], Charity fundraiser.

## 2020-02-06 ENCOUNTER — Telehealth: Payer: Self-pay | Admitting: General Practice

## 2020-02-06 NOTE — Telephone Encounter (Signed)
Called pt home & mobile phone left message for return call to schedule follow up with [facility] [phone].

## 2020-02-08 NOTE — Telephone Encounter (Signed)
Called pt home phone & mobile phone. Both have mailbox full & unable to leave message to schedule follow up at [facility].

## 2020-02-13 ENCOUNTER — Telehealth: Payer: Self-pay | Admitting: General Practice

## 2020-02-13 NOTE — Telephone Encounter (Signed)
Third attempt to reach pt to schedule follow up at [facility]. VM box full unable to leave message.

## 2020-04-24 ENCOUNTER — Telehealth: Payer: Self-pay | Admitting: Family Medicine

## 2020-04-24 NOTE — Telephone Encounter (Signed)
Patient called requesting a call back from Dr. Prince Rome only. Patient states to have medical questions. Please call patient at [phone].

## 2020-04-24 NOTE — Telephone Encounter (Signed)
Ok to schedule appointment for follow-up.

## 2020-04-25 NOTE — Telephone Encounter (Signed)
I called and left voice mail to call back (no name on the message/woman's voice).

## 2020-05-07 ENCOUNTER — Telehealth: Payer: Self-pay | Admitting: Family Medicine

## 2020-05-07 NOTE — Telephone Encounter (Signed)
Request for records from [facility] received. Sent to [employer].

## 2020-05-17 ENCOUNTER — Ambulatory Visit: Payer: Self-pay | Admitting: Family Medicine

## 2020-05-21 ENCOUNTER — Telehealth: Payer: Self-pay | Admitting: Family Medicine

## 2020-05-21 NOTE — Telephone Encounter (Signed)
Records re-faxed [employer]. Initially processed by [employer] 3/9

## 2020-05-23 ENCOUNTER — Ambulatory Visit: Payer: Self-pay | Admitting: Family Medicine

## 2020-05-29 ENCOUNTER — Other Ambulatory Visit: Payer: Self-pay

## 2020-05-29 ENCOUNTER — Ambulatory Visit (INDEPENDENT_AMBULATORY_CARE_PROVIDER_SITE_OTHER): Payer: Self-pay | Admitting: Family Medicine

## 2020-05-29 DIAGNOSIS — G8929 Other chronic pain: Secondary | ICD-10-CM

## 2020-05-29 DIAGNOSIS — M545 Low back pain, unspecified: Secondary | ICD-10-CM

## 2020-05-29 NOTE — Progress Notes (Signed)
   Office Visit Note   Patient: Blake Hampton           Date of Birth: 1975/06/22           MRN: [id number] Visit Date: 05/29/2020 Requested by: No referring provider defined for this encounter. PCP: Pcp, No  Subjective: Chief Complaint  Patient presents with  . Lower Back - Pain    Sharp pain in the center of his lower back with any shifting of position. Feels a pressure in the torso, from back to the front or from side-to-side, when he is standing and talking greater than 5 minutes.     HPI: He is here for follow-up chronic low back pain.  Since last visit he had a episode of severe pain requiring him to go to the ER.  He states that it was from coughing a lot due to Covid illness.  That pain has improved but he still has his chronic pain and weakness in his legs requiring him to use crutches when walking.               ROS:   All other systems were reviewed and are negative.  Objective: Vital Signs: There were no vitals taken for this visit.  Physical Exam:  General:  Alert and oriented, in no acute distress. Pulm:  Breathing unlabored. Psy:  Normal mood, congruent affect  Low back: He is tender to palpation in the lumbosacral spine in the midline.  Straight leg raising raise is equivocal bilaterally.  He is not able to actively extend his knees with good strength, or dorsiflex and evert his ankles.   Imaging: No results found.  Assessment & Plan: 1.  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 -Recommended referral to Dr. Alvester Morin for epidural injection.  If this does not help, then I think he should probably have nerve conduction studies.  If nerve conduction studies are normal, then he might benefit from a functional capacity evaluation versus going back to Dr. Ophelia Charter for surgical intervention.     Procedures: No procedures performed        PMFS History: There are no problems to display for this patient.  Past Medical History:  Diagnosis Date  . Back pain   .  Depression   . Hypertension     No family history on file.  No past surgical history on file. Social History   Occupational History  . Not on file  Tobacco Use  . Smoking status: Current Every Day Smoker    Types: Cigarettes  . Smokeless tobacco: Never Used  Substance and Sexual Activity  . Alcohol use: Not Currently  . Drug use: Not Currently  . Sexual activity: Not on file

## 2021-05-26 ENCOUNTER — Telehealth: Payer: Self-pay | Admitting: Family Medicine

## 2021-05-26 NOTE — Telephone Encounter (Signed)
Received vm from pt. IC,lmvm returning his call. ?

## 2022-03-06 IMAGING — DX DG CHEST 1V PORT
1 series · 1 of 1 positions shown · non-contrast
Comparison: None.

CLINICAL DATA: Shortness of breath and chest pain, LTV2V-R3
positivity

EXAM:
PORTABLE CHEST 1 VIEW

[chest]
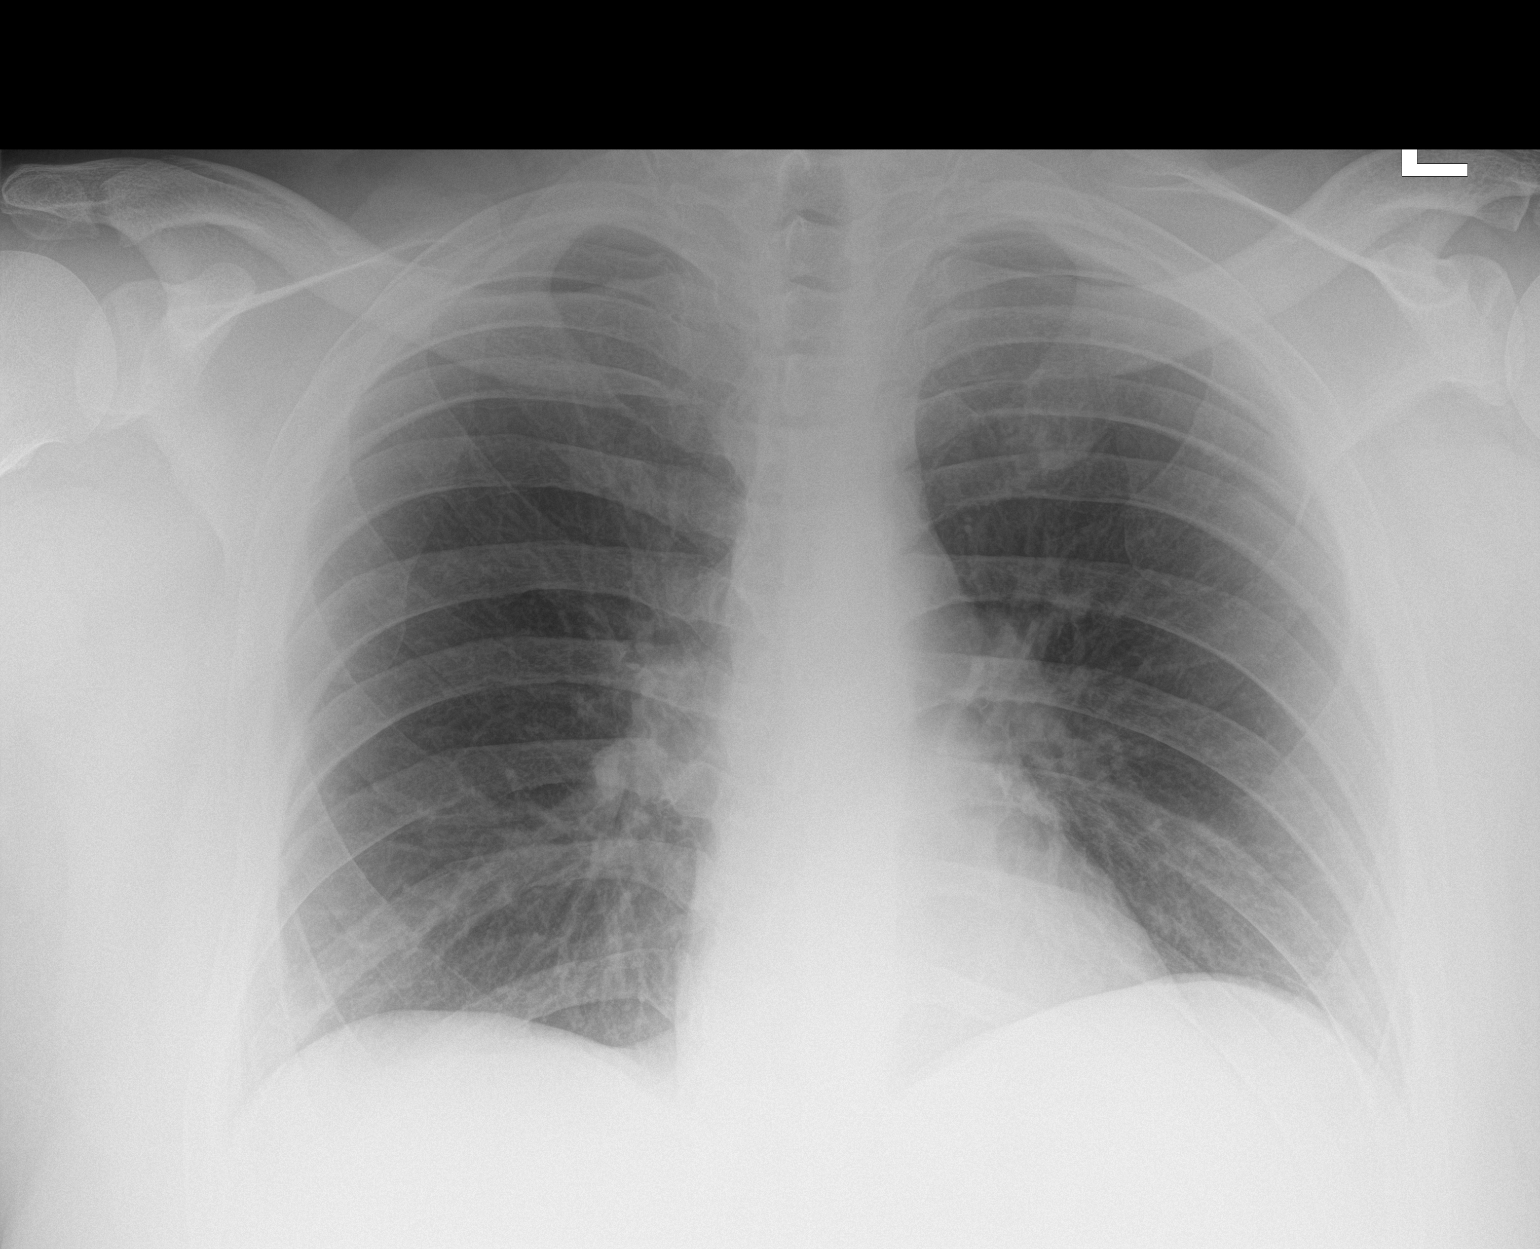

[1 of 1 positions shown; findings below may reference images not displayed]

FINDINGS: The heart size and mediastinal contours are within normal limits.
Both lungs are clear. The visualized skeletal structures are
unremarkable.
IMPRESSION: No active disease.

## 2023-01-20 ENCOUNTER — Ambulatory Visit (INDEPENDENT_AMBULATORY_CARE_PROVIDER_SITE_OTHER): Payer: MEDICAID | Admitting: Nurse Practitioner

## 2023-01-20 ENCOUNTER — Encounter: Payer: Self-pay | Admitting: Nurse Practitioner

## 2023-01-20 VITALS — BP 116/68 | HR 89 | Temp 97.6°F | Ht 72.0 in | Wt 280.0 lb

## 2023-01-20 DIAGNOSIS — Z1211 Encounter for screening for malignant neoplasm of colon: Secondary | ICD-10-CM

## 2023-01-20 DIAGNOSIS — M5442 Lumbago with sciatica, left side: Secondary | ICD-10-CM

## 2023-01-20 DIAGNOSIS — E1165 Type 2 diabetes mellitus with hyperglycemia: Secondary | ICD-10-CM | POA: Insufficient documentation

## 2023-01-20 DIAGNOSIS — R269 Unspecified abnormalities of gait and mobility: Secondary | ICD-10-CM | POA: Insufficient documentation

## 2023-01-20 DIAGNOSIS — M5441 Lumbago with sciatica, right side: Secondary | ICD-10-CM

## 2023-01-20 DIAGNOSIS — F32A Depression, unspecified: Secondary | ICD-10-CM

## 2023-01-20 DIAGNOSIS — Z0000 Encounter for general adult medical examination without abnormal findings: Secondary | ICD-10-CM

## 2023-01-20 DIAGNOSIS — M25511 Pain in right shoulder: Secondary | ICD-10-CM

## 2023-01-20 DIAGNOSIS — M25512 Pain in left shoulder: Secondary | ICD-10-CM

## 2023-01-20 DIAGNOSIS — M542 Cervicalgia: Secondary | ICD-10-CM | POA: Insufficient documentation

## 2023-01-20 DIAGNOSIS — M5116 Intervertebral disc disorders with radiculopathy, lumbar region: Secondary | ICD-10-CM | POA: Insufficient documentation

## 2023-01-20 DIAGNOSIS — G8929 Other chronic pain: Secondary | ICD-10-CM | POA: Insufficient documentation

## 2023-01-20 DIAGNOSIS — F419 Anxiety disorder, unspecified: Secondary | ICD-10-CM

## 2023-01-20 HISTORY — DX: Cervicalgia: M54.2

## 2023-01-20 HISTORY — DX: Other chronic pain: G89.29

## 2023-01-20 LAB — POCT GLYCOSYLATED HEMOGLOBIN (HGB A1C): Hemoglobin A1C: 7.4 % — AB (ref 4.0–5.6)

## 2023-01-20 MED ORDER — LANCETS MISC. MISC: 1.0000 | Freq: Two times a day (BID) | 4 refills | Status: AC

## 2023-01-20 MED ORDER — IBUPROFEN 600 MG PO TABS: 600.0000 mg | ORAL_TABLET | Freq: Three times a day (TID) | ORAL | 1 refills | Status: AC | PRN

## 2023-01-20 MED ORDER — CYCLOBENZAPRINE HCL 5 MG PO TABS: 5.0000 mg | ORAL_TABLET | Freq: Three times a day (TID) | ORAL | 1 refills | Status: AC | PRN

## 2023-01-20 MED ORDER — BLOOD GLUCOSE MONITORING SUPPL DEVI: 1.0000 | Freq: Two times a day (BID) | 0 refills | Status: AC

## 2023-01-20 MED ORDER — METFORMIN HCL ER (OSM) 500 MG PO TB24: 500.0000 mg | ORAL_TABLET | Freq: Two times a day (BID) | ORAL | 3 refills | Status: DC

## 2023-01-20 MED ORDER — METFORMIN HCL ER 500 MG PO TB24: 500.0000 mg | ORAL_TABLET | Freq: Two times a day (BID) | ORAL | 3 refills | Status: AC

## 2023-01-20 MED ORDER — BLOOD GLUCOSE TEST VI STRP: 1.0000 | ORAL_STRIP | Freq: Two times a day (BID) | 4 refills | Status: AC

## 2023-01-20 MED ORDER — LANCET DEVICE MISC: 1.0000 | Freq: Two times a day (BID) | 0 refills | Status: AC

## 2023-01-20 NOTE — Assessment & Plan Note (Addendum)
Lab Results  Component Value Date   HGBA1C 7.4 (A) 01/20/2023  New diagnosis of type 2 diabetes Start metformin 500 mg twice daily Patient counseled on low-carb modified diet CBG goals discussed,Glucometer ordered Moderate exercises as tolerated encouraged Checking urine microalbumin labs, lipid panel We discussed referral for diabetic eye exam at next visit Diabetic foot exam will be completed at next visit Follow-up in 4 weeks

## 2023-01-20 NOTE — Progress Notes (Signed)
q 

## 2023-01-20 NOTE — Progress Notes (Signed)
New Patient Office Visit  Subjective:  Patient ID: Blake Hampton, male    DOB: 22-Feb-1976  Age: 47 y.o. MRN: [id number]  CC:  Chief Complaint  Patient presents with   Establish Care    HPI Blake Hampton is a 47 y.o. male  has a past medical history of Back pain, Chronic pain of both shoulders (01/20/2023), Depression, Hypertension, and Neck pain (01/20/2023).   Patient presents to establish care for his chronic medical conditions.  Previous PCP was at [facility], stated that he has not been seen in many years.  He was also seeing orthopedics for his chronic back pain, neck pain and shoulder pain, he has not seen orthopedics in years.  Bilateral shoulder pain/cervicalgia/chronic low back pain.  Patient was being followed by orthopedics, he was last seen in 2021.  They had a consideration for epidural steroid injection but the patient stated that he did not want to procedure, he did physical therapy in the past which helped some.  He has been taking OTC ibuprofen and Skelaxin when his pain is bad(stated that he got Skelaxin from one of his dead relatives), changing position frequently also helps his pain. he uses a rolling walker for ambulation.  Stated that with walking his low back pain is an 8/10.  Has limited range of motion of bilateral shoulders due to pain, also reported pain rated 6/10 in his upper back.  Patient reports some sensation of pins-and-needles in both feet and lower back.  He denies fever, chills, urinary or bowel incontinence. He would like to  have the [employer] form completed, even though his insurance provides transportation for medical appointments, he still needs transportation for when going to church or when he missed to visit his mother who is at a nursing facility.  Anxiety and depression/PTSD.  Patient stated that his anxiety and depression is due to his chronic medical conditions, and due to his mother been sick,  he is not interested in medication.   Stated that he had therapy in the past.  Still not interested in medication but would like referral for therapy.  He denies SI, HI  Cologuard ordered to screen for colon cancer  Past Medical History:  Diagnosis Date   Back pain    Chronic pain of both shoulders 01/20/2023   Depression    Hypertension    Neck pain 01/20/2023    History reviewed. No pertinent surgical history.  Family History  Problem Relation Age of Onset   High blood pressure Mother    Stroke Mother    High blood pressure Father    Heart attack Father    Stroke Father    Heart murmur Brother    Heart attack Paternal Grandfather     Social History   Socioeconomic History   Marital status: Single    Spouse name: Not on file   Number of children: Not on file   Years of education: Not on file   Highest education level: Not on file  Occupational History   Not on file  Tobacco Use   Smoking status: Some Days    Types: Cigars   Smokeless tobacco: Never  Substance and Sexual Activity   Alcohol use: Not Currently   Drug use: Not Currently    Types: Marijuana   Sexual activity: Not Currently  Other Topics Concern   Not on file  Social History Narrative   Lives with his brother    Social Determinants of Health  Financial Resource Strain: Not on file  Food Insecurity: Not on file  Transportation Needs: Not on file  Physical Activity: Not on file  Stress: Not on file  Social Connections: Not on file  Intimate Partner Violence: Not on file    ROS Review of Systems  Constitutional:  Negative for appetite change, chills, fatigue and fever.  HENT:  Negative for congestion, postnasal drip, rhinorrhea and sneezing.   Respiratory:  Negative for cough, shortness of breath and wheezing.   Cardiovascular:  Negative for chest pain, palpitations and leg swelling.  Gastrointestinal:  Negative for abdominal pain, constipation, nausea and vomiting.  Genitourinary:  Negative for difficulty urinating, dysuria,  flank pain and frequency.  Musculoskeletal:  Positive for arthralgias, back pain and gait problem. Negative for joint swelling and myalgias.  Skin:  Negative for color change, pallor, rash and wound.  Neurological:  Negative for dizziness, facial asymmetry, weakness, numbness and headaches.  Psychiatric/Behavioral:  Negative for behavioral problems, confusion, self-injury and suicidal ideas.     Objective:   Today's Vitals: BP 116/68   Pulse 89   Temp 97.6 F (36.4 C)   Ht 6' (1.829 m)   Wt 280 lb (127 kg)   SpO2 100%   BMI 37.97 kg/m   Physical Exam Vitals and nursing note reviewed.  Constitutional:      General: He is not in acute distress.    Appearance: Normal appearance. He is obese. He is not ill-appearing, toxic-appearing or diaphoretic.  HENT:     Mouth/Throat:     Mouth: Mucous membranes are moist.     Pharynx: Oropharynx is clear. No oropharyngeal exudate or posterior oropharyngeal erythema.  Eyes:     General: No scleral icterus.       Right eye: No discharge.        Left eye: No discharge.     Extraocular Movements: Extraocular movements intact.     Conjunctiva/sclera: Conjunctivae normal.  Cardiovascular:     Rate and Rhythm: Normal rate and regular rhythm.     Pulses: Normal pulses.     Heart sounds: Normal heart sounds. No murmur heard.    No friction rub. No gallop.  Pulmonary:     Effort: Pulmonary effort is normal. No respiratory distress.     Breath sounds: Normal breath sounds. No stridor. No wheezing, rhonchi or rales.  Chest:     Chest wall: No tenderness.  Abdominal:     General: There is no distension.     Palpations: Abdomen is soft.     Tenderness: There is no abdominal tenderness. There is no right CVA tenderness, left CVA tenderness or guarding.  Musculoskeletal:        General: Tenderness present. No swelling, deformity or signs of injury.     Right lower leg: No edema.     Left lower leg: No edema.     Comments: Limited range of motion  of bilateral shoulders, tenderness on range of motion of bilateral shoulder, tenderness on palpation of low back area, using a rolling walker for ambulation.  Skin:    General: Skin is warm and dry.     Capillary Refill: Capillary refill takes less than 2 seconds.     Coloration: Skin is not jaundiced or pale.     Findings: No bruising, erythema or lesion.  Neurological:     Mental Status: He is alert and oriented to person, place, and time.     Motor: No weakness.     Coordination: Coordination  normal.     Gait: Gait abnormal.  Psychiatric:        Mood and Affect: Mood normal.        Behavior: Behavior normal.        Thought Content: Thought content normal.        Judgment: Judgment normal.     Assessment & Plan:   Problem List Items Addressed This Visit       Endocrine   Type 2 diabetes mellitus with hyperglycemia, without long-term current use of insulin (HCC) - Primary    Lab Results  Component Value Date   HGBA1C 7.4 (A) 01/20/2023  New diagnosis of type 2 diabetes Start metformin 500 mg twice daily Patient counseled on low-carb modified diet CBG goals discussed,Glucometer ordered Moderate exercises as tolerated encouraged Checking urine microalbumin labs, lipid panel We discussed referral for diabetic eye exam at next visit Diabetic foot exam will be completed at next visit Follow-up in 4 weeks      Relevant Medications   metFORMIN (GLUCOPHAGE-XR) 500 MG 24 hr tablet   Other Relevant Orders   Lipid panel   POCT glycosylated hemoglobin (Hb A1C) (Completed)   Microalbumin / creatinine urine ratio     Nervous and Auditory   Chronic low back pain with bilateral sciatica    Start Flexeril 5 mg 3 times daily as needed ibuprofen 600 mg every 8 hours as needed Patient encouraged to take ibuprofen with food and alternate with Tylenol 650 mg every 6 hours as needed Application of heat or ice encouraged Side effects of Flexeril including drowsiness discussed Patient  referred to orthopedics      Relevant Medications   cyclobenzaprine (FLEXERIL) 5 MG tablet   ibuprofen (ADVIL) 600 MG tablet   Radiculopathy due to lumbar intervertebral disc disorder    Start Flexeril 5 mg 3 times daily as needed ibuprofen 600 mg every 8 hours as needed Patient encouraged to take ibuprofen with food and alternate with Tylenol 650 mg every 6 hours as needed Application of heat or ice encouraged Side effects of Flexeril including drowsiness discussed Patient referred to orthopedics      Relevant Medications   cyclobenzaprine (FLEXERIL) 5 MG tablet     Other   Anxiety and depression    Patient declined medications Interested in therapy Referral sent for psychotherapy Flowsheet Row Office Visit from 01/20/2023 in [facility]  PHQ-9 Total Score 13          01/20/2023    8:58 AM  GAD 7 : Generalized Anxiety Score  Nervous, Anxious, on Edge 1  Control/stop worrying 2  Worry too much - different things 1  Trouble relaxing 2  Restless 2  Easily annoyed or irritable 3  Afraid - awful might happen 1  Total GAD 7 Score 12          Relevant Orders   Ambulatory referral to Psychology   Chronic pain of both shoulders    Start Flexeril 5 mg 3 times daily as needed ibuprofen 600 mg every 8 hours as needed Patient encouraged to take ibuprofen with food and alternate with Tylenol 650 mg every 6 hours as needed Application of heat or ice encouraged Side effects of Flexeril including drowsiness discussed Patient referred to orthopedics      Relevant Medications   cyclobenzaprine (FLEXERIL) 5 MG tablet   ibuprofen (ADVIL) 600 MG tablet   Other Relevant Orders   Ambulatory referral  to Orthopedic Surgery   Cervicalgia    Start Flexeril 5 mg 3 times daily as needed ibuprofen 600 mg every 8 hours as needed Patient encouraged to take ibuprofen with food and alternate with Tylenol 650 mg every 6 hours as  needed Application of heat or ice encouraged Side effects of Flexeril including drowsiness discussed Patient referred to orthopedics      Relevant Medications   cyclobenzaprine (FLEXERIL) 5 MG tablet   ibuprofen (ADVIL) 600 MG tablet   Abnormal gait    Using a rolling walker with seat for ambulation [place] form completed for transportation assistance      Other Visit Diagnoses     Screening for colon cancer       Relevant Orders   Cologuard   Health care maintenance       Relevant Orders   CBC   CMP14+EGFR       Outpatient Encounter Medications as of 01/20/2023  Medication Sig   acetaminophen (TYLENOL) 325 MG tablet Take 650 mg by mouth every 6 (six) hours as needed.   Blood Glucose Monitoring Suppl DEVI 1 each by Does not apply route 2 (two) times daily. May substitute to any manufacturer covered by patient's insurance.   cyclobenzaprine (FLEXERIL) 5 MG tablet Take 1 tablet (5 mg total) by mouth 3 (three) times daily as needed.   Glucose Blood (BLOOD GLUCOSE TEST STRIPS) STRP 1 each by In Vitro route in the morning and at bedtime. May substitute to any manufacturer covered by patient's insurance.   ibuprofen (ADVIL) 600 MG tablet Take 1 tablet (600 mg total) by mouth every 8 (eight) hours as needed.   Lancet Device MISC 1 each by Does not apply route in the morning and at bedtime. May substitute to any manufacturer covered by patient's insurance.   Lancets Misc. MISC 1 each by Does not apply route in the morning and at bedtime. May substitute to any manufacturer covered by patient's insurance.   metFORMIN (GLUCOPHAGE-XR) 500 MG 24 hr tablet Take 1 tablet (500 mg total) by mouth 2 (two) times daily with a meal.   [DISCONTINUED] metformin (FORTAMET) 500 MG (OSM) 24 hr tablet Take 1 tablet (500 mg total) by mouth 2 (two) times daily with a meal.   benzonatate (TESSALON) 100 MG capsule Take 1 capsule (100 mg total) by mouth every 8 (eight) hours as needed for cough. (Patient not  taking: Reported on 05/29/2020)   [DISCONTINUED] metaxalone (SKELAXIN) 800 MG tablet Take 1 tablet (800 mg total) by mouth 3 (three) times daily as needed for muscle spasms. (Patient not taking: Reported on 01/20/2023)   No facility-administered encounter medications on file as of 01/20/2023.    Follow-up: Return in about 5 weeks (around 02/24/2023).   Donell Beers, FNP

## 2023-01-20 NOTE — Patient Instructions (Addendum)
Goal for fasting blood sugar ranges from 80 to 120 and 2 hours after any meal or at bedtime should be between 130 to 170.     Please alternate ibuprofen with tylenol 650mg  every 6 hours as needed.  I encourage application of heat or ice as needed Please take ibuprofen with food to help prevent stomach upset  Please follow-up with the orthopedic specialist as discussed   1. Screening for colon cancer  - Cologuard  2. Anxiety and depression  - Ambulatory referral to Psychology  3. Chronic low back pain with bilateral sciatica, unspecified back pain laterality  - cyclobenzaprine (FLEXERIL) 5 MG tablet; Take 1 tablet (5 mg total) by mouth 3 (three) times daily as needed.  Dispense: 30 tablet; Refill: 1 - ibuprofen (ADVIL) 600 MG tablet; Take 1 tablet (600 mg total) by mouth every 8 (eight) hours as needed.  Dispense: 30 tablet; Refill: 1  4. Health care maintenance  - CBC - CMP14+EGFR  5. Neck pain  - cyclobenzaprine (FLEXERIL) 5 MG tablet; Take 1 tablet (5 mg total) by mouth 3 (three) times daily as needed.  Dispense: 30 tablet; Refill: 1 - ibuprofen (ADVIL) 600 MG tablet; Take 1 tablet (600 mg total) by mouth every 8 (eight) hours as needed.  Dispense: 30 tablet; Refill: 1  6. Chronic pain of both shoulders  - Ambulatory referral to Orthopedic Surgery - cyclobenzaprine (FLEXERIL) 5 MG tablet; Take 1 tablet (5 mg total) by mouth 3 (three) times daily as needed.  Dispense: 30 tablet; Refill: 1 - ibuprofen (ADVIL) 600 MG tablet; Take 1 tablet (600 mg total) by mouth every 8 (eight) hours as needed.  Dispense: 30 tablet; Refill: 1  7. Type 2 diabetes mellitus with hyperglycemia, without long-term current use of insulin (HCC)  - metformin (FORTAMET) 500 MG (OSM) 24 hr tablet; Take 1 tablet (500 mg total) by mouth 2 (two) times daily with a meal.  Dispense: 60 tablet; Refill: 3 - Lipid panel - Microalbumin / creatinine urine ratio      It is important that you exercise  regularly at least 30 minutes 5 times a week as tolerated  Think about what you will eat, plan ahead. Choose " clean, green, fresh or frozen" over canned, processed or packaged foods which are more sugary, salty and fatty. 70 to 75% of food eaten should be vegetables and fruit. Three meals at set times with snacks allowed between meals, but they must be fruit or vegetables. Aim to eat over a 12 hour period , example 7 am to 7 pm, and STOP after  your last meal of the day. Drink water,generally about 64 ounces per day, no other drink is as healthy. Fruit juice is best enjoyed in a healthy way, by EATING the fruit.  Thanks for choosing [facility] we consider it a privelige to serve you.

## 2023-01-20 NOTE — Assessment & Plan Note (Signed)
Start Flexeril 5 mg 3 times daily as needed ibuprofen 600 mg every 8 hours as needed Patient encouraged to take ibuprofen with food and alternate with Tylenol 650 mg every 6 hours as needed Application of heat or ice encouraged Side effects of Flexeril including drowsiness discussed Patient referred to orthopedics

## 2023-01-20 NOTE — Assessment & Plan Note (Signed)
Using a rolling walker with seat for ambulation [employer] form completed for transportation assistance

## 2023-01-20 NOTE — Assessment & Plan Note (Signed)
Patient declined medications Interested in therapy Referral sent for psychotherapy Flowsheet Row Office Visit from 01/20/2023 in [facility]  PHQ-9 Total Score 13          01/20/2023    8:58 AM  GAD 7 : Generalized Anxiety Score  Nervous, Anxious, on Edge 1  Control/stop worrying 2  Worry too much - different things 1  Trouble relaxing 2  Restless 2  Easily annoyed or irritable 3  Afraid - awful might happen 1  Total GAD 7 Score 12

## 2023-01-21 LAB — LIPID PANEL
Chol/HDL Ratio: 5.9 ratio — ABNORMAL HIGH (ref 0.0–5.0)
Cholesterol, Total: 172 mg/dL (ref 100–199)
HDL: 29 mg/dL — ABNORMAL LOW (ref >39)
LDL Chol Calc (NIH): 107 mg/dL — ABNORMAL HIGH (ref 0–99)
Triglycerides: 207 mg/dL — ABNORMAL HIGH (ref 0–149)
VLDL Cholesterol Cal: 36 mg/dL (ref 5–40)

## 2023-01-21 LAB — CMP14+EGFR
ALT: 53 [IU]/L — ABNORMAL HIGH (ref 0–44)
AST: 32 [IU]/L (ref 0–40)
Albumin: 4.7 g/dL (ref 4.1–5.1)
Alkaline Phosphatase: 73 [IU]/L (ref 44–121)
BUN/Creatinine Ratio: 8 — ABNORMAL LOW (ref 9–20)
BUN: 9 mg/dL (ref 6–24)
Bilirubin Total: 0.2 mg/dL (ref 0.0–1.2)
CO2: 22 mmol/L (ref 20–29)
Calcium: 9.4 mg/dL (ref 8.7–10.2)
Chloride: 101 mmol/L (ref 96–106)
Creatinine, Ser: 1.07 mg/dL (ref 0.76–1.27)
Globulin, Total: 3.2 g/dL (ref 1.5–4.5)
Glucose: 138 mg/dL — ABNORMAL HIGH (ref 70–99)
Potassium: 4.5 mmol/L (ref 3.5–5.2)
Sodium: 139 mmol/L (ref 134–144)
Total Protein: 7.9 g/dL (ref 6.0–8.5)
eGFR: 86 mL/min/{1.73_m2} (ref >59)

## 2023-01-21 LAB — CBC
Hematocrit: 43.9 % (ref 37.5–51.0)
Hemoglobin: 14.4 g/dL (ref 13.0–17.7)
MCH: 28.2 pg (ref 26.6–33.0)
MCHC: 32.8 g/dL (ref 31.5–35.7)
MCV: 86 fL (ref 79–97)
Platelets: 306 10*3/uL (ref 150–450)
RBC: 5.1 x10E6/uL (ref 4.14–5.80)
RDW: 12.9 % (ref 11.6–15.4)
WBC: 6.1 10*3/uL (ref 3.4–10.8)

## 2023-01-22 ENCOUNTER — Other Ambulatory Visit (HOSPITAL_COMMUNITY): Payer: Self-pay | Admitting: Nurse Practitioner

## 2023-01-22 DIAGNOSIS — E785 Hyperlipidemia, unspecified: Secondary | ICD-10-CM

## 2023-01-22 MED ORDER — ATORVASTATIN CALCIUM 10 MG PO TABS: 10.0000 mg | ORAL_TABLET | Freq: Every day | ORAL | 1 refills | Status: AC

## 2023-01-25 ENCOUNTER — Other Ambulatory Visit (HOSPITAL_COMMUNITY): Payer: Self-pay | Admitting: Nurse Practitioner

## 2023-01-25 ENCOUNTER — Telehealth: Payer: Self-pay

## 2023-01-25 DIAGNOSIS — Z1211 Encounter for screening for malignant neoplasm of colon: Secondary | ICD-10-CM

## 2023-01-25 DIAGNOSIS — K625 Hemorrhage of anus and rectum: Secondary | ICD-10-CM

## 2023-01-25 NOTE — Telephone Encounter (Signed)
Pt was advised [facility]

## 2023-01-25 NOTE — Telephone Encounter (Signed)
Pt was called to advise of [place] forms are complete. He stated that he will come by and pick it up.Pt also advised that he has had on and off rectal bleeding and was ordered a cologurad kit. Please advise if you will place GI referral.   Renelda Loma RMA

## 2023-02-02 ENCOUNTER — Telehealth: Payer: Self-pay

## 2023-02-02 NOTE — Telephone Encounter (Signed)
Copied from CRM [id number]. Topic: Medical Record Request - Other >> Feb 02, 2023  9:46 AM Thomes Dinning wrote: Reason for CRM: PT is needing clarification on why only certain question's were answered on his Professional verification forms. PT would like a call back at [phone]  Form placed in your folder for correction. KH

## 2023-02-15 ENCOUNTER — Telehealth: Payer: Self-pay

## 2023-02-15 NOTE — Telephone Encounter (Signed)
Copied from CRM [id number]. Topic: General - Other >> Feb 15, 2023  2:30 PM Prudencio Pair wrote: Reason for CRM: Patient states someone from clinic gave him a call. He states he dropped off paperwork for access to [facility]. Please give patient a call back. CB #: [phone].

## 2023-02-23 ENCOUNTER — Ambulatory Visit: Payer: Self-pay | Admitting: Nurse Practitioner

## 2023-03-02 LAB — COLOGUARD: COLOGUARD: NEGATIVE

## 2023-03-05 ENCOUNTER — Ambulatory Visit: Payer: MEDICAID | Admitting: Orthopaedic Surgery

## 2023-03-16 ENCOUNTER — Ambulatory Visit: Payer: MEDICAID | Admitting: Orthopaedic Surgery

## 2023-03-17 ENCOUNTER — Ambulatory Visit: Payer: Self-pay | Admitting: Nurse Practitioner

## 2023-03-23 ENCOUNTER — Ambulatory Visit: Payer: MEDICAID | Admitting: Orthopaedic Surgery

## 2023-03-26 ENCOUNTER — Ambulatory Visit: Payer: MEDICAID | Admitting: Orthopaedic Surgery

## 2023-04-02 ENCOUNTER — Encounter: Payer: Self-pay | Admitting: Orthopaedic Surgery

## 2023-04-02 ENCOUNTER — Other Ambulatory Visit (INDEPENDENT_AMBULATORY_CARE_PROVIDER_SITE_OTHER): Payer: MEDICAID

## 2023-04-02 ENCOUNTER — Ambulatory Visit (INDEPENDENT_AMBULATORY_CARE_PROVIDER_SITE_OTHER): Payer: MEDICAID | Admitting: Orthopaedic Surgery

## 2023-04-02 VITALS — BP 127/79 | HR 97

## 2023-04-02 DIAGNOSIS — M545 Low back pain, unspecified: Secondary | ICD-10-CM

## 2023-04-02 DIAGNOSIS — M542 Cervicalgia: Secondary | ICD-10-CM

## 2023-04-02 NOTE — Progress Notes (Unsigned)
   Office Visit Note   Patient: Blake Hampton           Date of Birth: Jan 21, 1976           MRN: [id number] Visit Date: 04/02/2023              Requested by: Donell Beers, FNP [phone] [address], [address] [place],  [place] [address] PCP: Donell Beers, FNP   Assessment & Plan: Visit Diagnoses: No diagnosis found.  Plan: ***  Follow-Up Instructions: No follow-ups on file.   Orders:  No orders of the defined types were placed in this encounter.  No orders of the defined types were placed in this encounter.     Procedures: No procedures performed   Clinical Data: No additional findings.   Subjective: Chief Complaint  Patient presents with   Right Shoulder - Pain   Left Shoulder - Pain   Lower Back - Pain    HPI  Review of Systems   Objective: Vital Signs: BP 127/79   Pulse 97   Physical Exam  Ortho Exam  Specialty Comments:  No specialty comments available.  Imaging: No results found.   PMFS History: Patient Active Problem List   Diagnosis Date Noted   Anxiety and depression 01/20/2023   Chronic pain of both shoulders 01/20/2023   Cervicalgia 01/20/2023   Type 2 diabetes mellitus with hyperglycemia, without long-term current use of insulin (HCC) 01/20/2023   Chronic low back pain with bilateral sciatica 01/20/2023   Radiculopathy due to lumbar intervertebral disc disorder 01/20/2023   Abnormal gait 01/20/2023   Past Medical History:  Diagnosis Date   Back pain    Chronic pain of both shoulders 01/20/2023   Depression    Hypertension    Neck pain 01/20/2023    Family History  Problem Relation Age of Onset   High blood pressure Mother    Stroke Mother    High blood pressure Father    Heart attack Father    Stroke Father    Heart murmur Brother    Heart attack Paternal Grandfather     No past surgical history on file. Social History   Occupational History   Not on file  Tobacco Use   Smoking status: Some Days     Types: Cigars   Smokeless tobacco: Never  Substance and Sexual Activity   Alcohol use: Not Currently   Drug use: Not Currently    Types: Marijuana   Sexual activity: Not Currently

## 2023-04-05 ENCOUNTER — Telehealth: Payer: Self-pay | Admitting: Orthopaedic Surgery

## 2023-04-05 NOTE — Telephone Encounter (Signed)
Patient states that he was conta

## 2023-04-05 NOTE — Telephone Encounter (Signed)
Patient states that he was having some serious bowel issues during his appointment with Dr Ophelia Charter on Friday 04/02/23. Pt states that was the reason he wasn't able to give his all when doing the things Dr Ophelia Charter wanted him to do. Pt request a call back to explain what's going on with him.

## 2023-04-07 NOTE — Telephone Encounter (Signed)
 I called, left voicemail.

## 2023-04-17 ENCOUNTER — Inpatient Hospital Stay: Admission: RE | Admit: 2023-04-17 | Payer: MEDICAID | Source: Ambulatory Visit

## 2023-04-23 ENCOUNTER — Ambulatory Visit: Payer: MEDICAID | Admitting: Orthopaedic Surgery

## 2023-04-29 ENCOUNTER — Encounter: Payer: Self-pay | Admitting: Orthopaedic Surgery

## 2023-04-30 ENCOUNTER — Ambulatory Visit: Payer: MEDICAID | Admitting: Orthopaedic Surgery

## 2023-05-11 ENCOUNTER — Ambulatory Visit
Admission: RE | Admit: 2023-05-11 | Discharge: 2023-05-11 | Disposition: A | Discharge status: Home / Self Care | Payer: MEDICAID | Source: Ambulatory Visit | Attending: Orthopaedic Surgery | Admitting: Orthopaedic Surgery

## 2023-05-11 DIAGNOSIS — M545 Low back pain, unspecified: Secondary | ICD-10-CM

## 2023-05-18 ENCOUNTER — Encounter: Payer: Self-pay | Admitting: Orthopaedic Surgery

## 2023-05-18 ENCOUNTER — Ambulatory Visit (INDEPENDENT_AMBULATORY_CARE_PROVIDER_SITE_OTHER): Payer: MEDICAID | Admitting: Orthopaedic Surgery

## 2023-05-18 VITALS — BP 130/77

## 2023-05-18 DIAGNOSIS — M5126 Other intervertebral disc displacement, lumbar region: Secondary | ICD-10-CM

## 2023-05-18 NOTE — Progress Notes (Signed)
 Office Visit Note   Patient: Blake Hampton           Date of Birth: 08-29-1975           MRN: [id number] Visit Date: 05/18/2023              Requested by: Donell Beers, FNP [id number] S. [address], [address] [place],  [place] [address] PCP: Donell Beers, FNP   Assessment & Plan: Visit Diagnoses:  1. Protrusion of lumbar intervertebral disc     Plan: We discussed his multilevel involvement reviewed MRIs with him from 2021, 2023 and then recent MRI done 2025 1 week ago.  Will send him up for single epidural injection likely at the L4-5 level for disc protrusion and some stenosis.  Discussed with them that multilevel surgery is usually the consideration when he has stenosis at several levels.  Sometimes this involves fusion or no fusion.  He does have some degenerative anterolisthesis at L2-3 and L3-4.  Facet arthropathy noted increased at those levels as well.  Hopefully get good relief with epidural and if not he can follow-up with Dr. Christell Constant.  Follow-Up Instructions: No follow-ups on file.   Orders:  Orders Placed This Encounter  Procedures   Ambulatory referral to Physical Medicine Rehab   No orders of the defined types were placed in this encounter.     Procedures: No procedures performed   Clinical Data: No additional findings.   Subjective: Chief Complaint  Patient presents with   Lower Back - Pain, Follow-up    MRI review    HPI 48 year old male returns post MRI scan lumbar which shows some progression of multilevel disc degeneration now with moderate to prominent impingement L4-5 moderate L5-S1 and mild at L2-3 and L3-4.  L4-5 particularly had progressed.  Patient is on SSI.  Continues to have pain between his shoulder blades.  Type 2 diabetes.  Review of Systems updated unchanged positive for PTSD.  He is using a rollator walker.   Objective: Vital Signs: BP 130/77   Physical Exam Constitutional:      Appearance: He is well-developed.  HENT:      Head: Normocephalic and atraumatic.     Right Ear: External ear normal.     Left Ear: External ear normal.  Eyes:     Pupils: Pupils are equal, round, and reactive to light.  Neck:     Thyroid: No thyromegaly.     Trachea: No tracheal deviation.  Cardiovascular:     Rate and Rhythm: Normal rate.  Pulmonary:     Effort: Pulmonary effort is normal.     Breath sounds: No wheezing.  Abdominal:     General: Bowel sounds are normal.     Palpations: Abdomen is soft.  Musculoskeletal:     Cervical back: Neck supple.  Skin:    General: Skin is warm and dry.     Capillary Refill: Capillary refill takes less than 2 seconds.  Neurological:     Mental Status: He is alert and oriented to person, place, and time.  Psychiatric:        Behavior: Behavior normal.        Thought Content: Thought content normal.        Judgment: Judgment normal.     Ortho Exam ankle dorsiflexion plantarflexion is intact.  Specialty Comments:  No specialty comments available.  Imaging: Study Result  Narrative & Impression  CLINICAL DATA:  Low back pain and chronic right lower extremity pain  EXAM: MRI LUMBAR SPINE WITHOUT CONTRAST   TECHNIQUE: Multiplanar, multisequence MR imaging of the lumbar spine was performed. No intravenous contrast was administered.   COMPARISON:  09/22/2019   FINDINGS: Segmentation: The lowest lumbar type non-rib-bearing vertebra is labeled as L5.   Alignment:  3 mm degenerative retrolisthesis at L2-3 and L3-4.   Vertebrae: Disc desiccation at all levels between L2 and S1 with loss of disc height but relative spurring of L4-5.   Type 2 degenerative endplate findings at L5-S1.   Conus medullaris and cauda equina: Conus extends to the upper L2 level. Conus and cauda equina appear normal.   Paraspinal and other soft tissues: Unremarkable   Disc levels:   T12-L1: Unremarkable   L1-2: Unremarkable   L2-3: Mild central narrowing of the thecal sac due to disc  bulge. This is minimally worsened from prior.   L3-4: Mild bilateral foraminal stenosis with mild displacement of both L3 nerves in the lateral extraforaminal space along with borderline central narrowing of the thecal sac due to disc bulge, intervertebral spurring, and facet arthropathy. Minimally worsened from prior.   L4-5: Moderate to prominent central narrowing of the thecal sac with moderate left and mild right subarticular lateral recess stenosis due to disc bulge and central disc protrusion, worsened from prior.   L5-S1: Moderate left and mild right foraminal stenosis mild left subarticular lateral recess stenosis due to central and left paracentral disc protrusion, intervertebral spurring, facet arthropathy, and disc bulge.   IMPRESSION: 1. Lumbar spondylosis and degenerative disc disease, causing moderate to prominent impingement at L4-5; moderate impingement at L5-S1; mild impingement at L2-3 and L3-4 as noted above. Impingement at several levels is worsened from prior, particularly at L4-5.     Electronically Signed   By: Gaylyn Rong M.D.   On: 05/18/2023 14:00       PMFS History: Patient Active Problem List   Diagnosis Date Noted   Anxiety and depression 01/20/2023   Chronic pain of both shoulders 01/20/2023   Cervicalgia 01/20/2023   Type 2 diabetes mellitus with hyperglycemia, without long-term current use of insulin (HCC) 01/20/2023   Chronic low back pain with bilateral sciatica 01/20/2023   Radiculopathy due to lumbar intervertebral disc disorder 01/20/2023   Abnormal gait 01/20/2023   Past Medical History:  Diagnosis Date   Back pain    Chronic pain of both shoulders 01/20/2023   Depression    Hypertension    Neck pain 01/20/2023    Family History  Problem Relation Age of Onset   High blood pressure Mother    Stroke Mother    High blood pressure Father    Heart attack Father    Stroke Father    Heart murmur Brother    Heart attack  Paternal Grandfather     No past surgical history on file. Social History   Occupational History   Not on file  Tobacco Use   Smoking status: Some Days    Types: Cigars   Smokeless tobacco: Never  Substance and Sexual Activity   Alcohol use: Not Currently   Drug use: Not Currently    Types: Marijuana   Sexual activity: Not Currently

## 2023-05-20 ENCOUNTER — Telehealth: Payer: Self-pay | Admitting: Orthopaedic Surgery

## 2023-05-20 NOTE — Telephone Encounter (Signed)
 Received call from patient. He requesting 05/18/23 ov note and MRI report. I advised him I will have ready for him to pick up and the auth will be attached for him to sign.

## 2023-06-04 ENCOUNTER — Other Ambulatory Visit: Payer: Self-pay | Admitting: Physical Medicine and Rehabilitation

## 2023-06-04 MED ORDER — DIAZEPAM 5 MG PO TABS: ORAL_TABLET | ORAL | 0 refills | Status: AC

## 2023-06-10 ENCOUNTER — Encounter: Payer: MEDICAID | Admitting: Physical Medicine and Rehabilitation

## 2023-11-23 ENCOUNTER — Encounter: Payer: Self-pay | Admitting: Physical Medicine and Rehabilitation

## 2023-11-23 ENCOUNTER — Ambulatory Visit (INDEPENDENT_AMBULATORY_CARE_PROVIDER_SITE_OTHER): Payer: MEDICAID | Admitting: Physical Medicine and Rehabilitation

## 2023-11-23 DIAGNOSIS — M5416 Radiculopathy, lumbar region: Secondary | ICD-10-CM | POA: Diagnosis not present

## 2023-11-23 DIAGNOSIS — G8929 Other chronic pain: Secondary | ICD-10-CM

## 2023-11-23 DIAGNOSIS — M5441 Lumbago with sciatica, right side: Secondary | ICD-10-CM | POA: Diagnosis not present

## 2023-11-23 DIAGNOSIS — R29898 Other symptoms and signs involving the musculoskeletal system: Secondary | ICD-10-CM

## 2023-11-23 MED ORDER — METHOCARBAMOL 500 MG PO TABS: 500.0000 mg | ORAL_TABLET | Freq: Three times a day (TID) | ORAL | 0 refills | Status: AC

## 2023-11-23 NOTE — Progress Notes (Unsigned)
 Blake Hampton - 48 y.o. male MRN [id number]  Date of birth: [birth year]  Office Visit Note: Visit Date: 11/23/2023 PCP: Paseda, Folashade R, FNP Referred by: Paseda, Folashade R, FNP  Subjective: Chief Complaint  Patient presents with   Lower Back - Pain   HPI: Blake Hampton is a 48 y.o. male who comes in today for evaluation of chronic, worsening and severe bilateral lower back pain radiating to buttock and down leg. Also reports numbness/tingling to right foot. He is previous patient of Dr. Oneil Herald. Pain ongoing for several years. His pain worsens with prolonged standing and walking. He describes pain as sharp, stabbing and spasm sensation, currently rates as 8 out of 10. States his pain is very aggravating. Some relief of pain with home exercise regimen, rest and use of medications. No history of formal physical therapy. Lumbar MRI imaging from March shows moderate to prominent central narrowing of the thecal sac at L4-L5. He was scheduled to have lumbar epidural steroid injection in our office this past March, however his Mother passed away and had to post pone injection. He is currently using crutches to assist with ambulation.  He also reports multiple falls in the last several months. States he falls due to spasms and severe pain in his back. These spasms cause pain to radiate all the way up to his neck, reports he is unable to move extremities when this occurs. He recently fell onto wall in bedroom, also reports fall in shower and on porch.   He reports previous issues with chronic bilateral shoulder and knee pain.      Review of Systems  Musculoskeletal:  Positive for back pain, joint pain, myalgias and neck pain.  Neurological:  Positive for tingling and focal weakness.  All Blake systems reviewed and are negative.  Otherwise per HPI.  Assessment & Plan: Visit Diagnoses:    ICD-10-CM   1. Chronic bilateral low back pain with right-sided sciatica  G89.29 Ambulatory  referral to Physical Medicine Rehab   M54.41 Ambulatory referral to Orthopedic Surgery    2. Lumbar radiculopathy  M54.16 Ambulatory referral to Physical Medicine Rehab    Ambulatory referral to Orthopedic Surgery    3. Leg weakness, bilateral  R29.898 Ambulatory referral to Physical Medicine Rehab    Ambulatory referral to Orthopedic Surgery       Plan: Findings:  Chronic, worsening and severe bilateral lower back pain radiating to buttock and down leg. Patient continues to have severe pain despite good conservative therapies such as home exercise regimen, rest and use of medications. I discussed prior lumbar MRI with him today using imaging and spine model. There is moderate spinal canal stenosis at L4-L5. He was unable to participate in exam today likely due to lack of effort and increased pain. His weakness does not correlate with recent lumbar MRI findings. We discussed treatment plan today, next step is to perform diagnostic and hopefully therapeutic right L5-S1 interlaminar epidural steroid injection. He is not currently taking anticoagulant medication. I discussed injection procedure with him in detail today, he has no questions at this time. I also placed referral to our spine surgeon Dr. Ozell Ada for further evaluation. No new red flag symptoms noted.     Meds & Orders:  Meds ordered this encounter  Medications   methocarbamol  (ROBAXIN ) 500 MG tablet    Sig: Take 1 tablet (500 mg total) by mouth 3 (three) times daily.    Dispense:  90 tablet    Refill:  0    Orders Placed This Encounter  Procedures   Ambulatory referral to Physical Medicine Rehab   Ambulatory referral to Orthopedic Surgery    Follow-up: Return for Right L5-S1 interlaminar epidural steroid injection.   Procedures: No procedures performed      Clinical History: MRI LUMBAR SPINE WITHOUT CONTRAST   TECHNIQUE: Multiplanar, multisequence MR imaging of the lumbar spine was performed. No intravenous  contrast was administered.   COMPARISON:  09/22/2019   FINDINGS: Segmentation: The lowest lumbar type non-rib-bearing vertebra is labeled as L5.   Alignment:  3 mm degenerative retrolisthesis at L2-3 and L3-4.   Vertebrae: Disc desiccation at all levels between L2 and S1 with loss of disc height but relative spurring of L4-5.   Type 2 degenerative endplate findings at L5-S1.   Conus medullaris and cauda equina: Conus extends to the upper L2 level. Conus and cauda equina appear normal.   Paraspinal and Blake soft tissues: Unremarkable   Disc levels:   T12-L1: Unremarkable   L1-2: Unremarkable   L2-3: Mild central narrowing of the thecal sac due to disc bulge. This is minimally worsened from prior.   L3-4: Mild bilateral foraminal stenosis with mild displacement of both L3 nerves in the lateral extraforaminal space along with borderline central narrowing of the thecal sac due to disc bulge, intervertebral spurring, and facet arthropathy. Minimally worsened from prior.   L4-5: Moderate to prominent central narrowing of the thecal sac with moderate left and mild right subarticular lateral recess stenosis due to disc bulge and central disc protrusion, worsened from prior.   L5-S1: Moderate left and mild right foraminal stenosis mild left subarticular lateral recess stenosis due to central and left paracentral disc protrusion, intervertebral spurring, facet arthropathy, and disc bulge.   IMPRESSION: 1. Lumbar spondylosis and degenerative disc disease, causing moderate to prominent impingement at L4-5; moderate impingement at L5-S1; mild impingement at L2-3 and L3-4 as noted above. Impingement at several levels is worsened from prior, particularly at L4-5.     Electronically Signed   By: Ryan Salvage M.D.   On: 05/18/2023 14:00   He reports that he has been smoking cigars. He has never used smokeless tobacco.  Recent Labs    01/20/23 1015  HGBA1C 7.4*     Objective:  VS:  HT:    WT:   BMI:     BP:   HR: bpm  TEMP: ( )  RESP:  Physical Exam Vitals and nursing note reviewed.  HENT:     Head: Normocephalic and atraumatic.     Right Ear: External ear normal.     Left Ear: External ear normal.     Nose: Nose normal.     Mouth/Throat:     Mouth: Mucous membranes are moist.  Eyes:     Extraocular Movements: Extraocular movements intact.  Cardiovascular:     Rate and Rhythm: Normal rate.     Pulses: Normal pulses.  Pulmonary:     Effort: Pulmonary effort is normal.  Abdominal:     General: Abdomen is flat. There is no distension.  Musculoskeletal:        General: Tenderness present.     Comments: Patient unable to perform lumbar exam today due to lack of participation and increased pain. He is able to use counter to pull himself up and stand, however he places quite a bit of pressure on his arms.  It did take him several minutes to ambulate back to exam room today.  Skin:    General: Skin is warm and dry.     Capillary Refill: Capillary refill takes less than 2 seconds.  Neurological:     Mental Status: He is oriented to person, place, and time.     Motor: Weakness present.     Gait: Gait abnormal.  Psychiatric:        Mood and Affect: Mood normal.        Behavior: Behavior normal.     Ortho Exam  Imaging: No results found.  Past Medical/Family/Surgical/Social History: Medications & Allergies reviewed per EMR, new medications updated. Patient Active Problem List   Diagnosis Date Noted   Anxiety and depression 01/20/2023   Chronic pain of both shoulders 01/20/2023   Cervicalgia 01/20/2023   Type 2 diabetes mellitus with hyperglycemia, without long-term current use of insulin (HCC) 01/20/2023   Chronic low back pain with bilateral sciatica 01/20/2023   Radiculopathy due to lumbar intervertebral disc disorder 01/20/2023   Abnormal gait 01/20/2023   Past Medical History:  Diagnosis Date   Back pain    Chronic  pain of both shoulders 01/20/2023   Depression    Hypertension    Neck pain 01/20/2023   Family History  Problem Relation Age of Onset   High blood pressure Mother    Stroke Mother    High blood pressure Father    Heart attack Father    Stroke Father    Heart murmur Brother    Heart attack Paternal Grandfather    No past surgical history on file. Social History   Occupational History   Not on file  Tobacco Use   Smoking status: Some Days    Types: Cigars   Smokeless tobacco: Never  Substance and Sexual Activity   Alcohol use: Not Currently   Drug use: Not Currently    Types: Marijuana   Sexual activity: Not Currently

## 2023-11-23 NOTE — Progress Notes (Unsigned)
 Pain Scale   Average Pain 10 Patient advising he has severe lower back pain and and was scheduled for [facility] however had to cancel injection. Patient advising he also had several falls and is now C/O pain through out his spine        +Driver, -BT, -Dye Allergies.

## 2023-12-20 ENCOUNTER — Ambulatory Visit (INDEPENDENT_AMBULATORY_CARE_PROVIDER_SITE_OTHER): Payer: MEDICAID | Admitting: Orthopedic Surgery

## 2023-12-20 ENCOUNTER — Other Ambulatory Visit (INDEPENDENT_AMBULATORY_CARE_PROVIDER_SITE_OTHER): Payer: MEDICAID

## 2023-12-20 VITALS — BP 109/75 | HR 114 | Ht 72.0 in

## 2023-12-20 DIAGNOSIS — M545 Low back pain, unspecified: Secondary | ICD-10-CM

## 2023-12-20 NOTE — Progress Notes (Signed)
 Orthopedic Spine Surgery Office Note  Assessment: Patient is a 48 y.o. male with low back pain that radiates into the right lower extremity along the posterior aspect of the thigh and leg   Plan: - Patient has tried multiple conservative treatments and pain has gotten to the point that he is taking oxycodone .  Discussed right sided hemilaminotomy and microdiscectomy as an option.  I explained that it would not completely relieve his pain given the duration of his symptoms and the severity of them.  I told him a realistic expectation is that it would help him some but he would likely still have significant pain. I explained that surgery is not predictable at relieving back pain either.  I covered the risks associated with surgery. After discussing this option, he was more interested in getting an injection.  Referral was provided to him today. -Patient should return to the office on an as needed basis   Patient expressed understanding of the plan and all questions were answered to the patient's satisfaction.   ___________________________________________________________________________   History:  Patient is a 48 y.o. male who presents today for lumbar spine.  Patient has had about 9 years of low back pain that radiates into the right lower extremity.  He feels it going along the posterior aspect of the thigh and leg.  Sometimes he will feel it anteriorly as well.  The leg will start to cramp up on him.  He will lose his balance when the pain gets severe.  Has had falls as a result of this pain.  He has started using a walker as a result of this pain.  He started using that walker in 2018.  He walks very slowly.  He has pain on a daily basis.  He notes the pain with activity and with rest.  No bowel or bladder incontinence.  No saddle anesthesia.   Treatments tried: PT, massage, tylenol , ibuprofen , robaxin , oxycodone   Review of systems: Denies fevers and chills, night sweats, unexplained weight  loss, history of cancer. Has had pain that wakes him at night  Past medical history: Chronic pain Depression HTN  Allergies: rofecoxib  Past surgical history:  None  Social history: Denies use of nicotine  product (smoking, vaping, patches, smokeless) Alcohol use: denies Denies recreational drug use   Physical Exam:  General: no acute distress, appears stated age Neurologic: alert, answering questions appropriately, following commands Respiratory: unlabored breathing on room air, symmetric chest rise Psychiatric: appropriate affect, normal cadence to speech   MSK (spine):  -Strength exam      Left  Right EHL    4/5  3/5 TA    4/5  3/5 GSC    4/5  3/5 Knee extension  4/5  3/5 Hip flexion   4/5  4/5  Pain limits his ability to participate with exam.  Pain was felt with even dorsiflexion and plantarflexion at the ankle -felt the pain in his low back  -Sensory exam    Sensation intact to light touch in L3-S1 nerve distributions of bilateral lower extremities  -Achilles DTR: 1/4 on the left, 1/4 on the right -Patellar tendon DTR: 1/4 on the left, 1/4 on the right -Biceps DTR: 2/4 on the left, 2/4 on the right -Brachioradialis DTR: 2/4 in the left, 2/4 in the right -Negative Hoffmann bilaterally -Negative grip release -No interosseous muscle wasting seen  -TTP with even superficial palpation over the lumbar spine  -Straight leg raise: Negative bilaterally -Clonus: no beats bilaterally  -Left hip exam:  No pain through range of motion -Right hip exam: No pain through range of motion  Imaging: XRs of the lumbar spine from 12/20/2023 were independently reviewed and interpreted, showing disc height loss at L5/S1.  No other significant degenerative changes seen. No evidence of instability on flexion/extension views. No fracture or dislocation seen.  MRI of the lumbar spine from 05/11/2023 was independently reviewed and interpreted, showing central stenosis at L3/4.   Left paracentral disc herniation at L4/5 causing lateral recess stenosis.  Central disc herniation at L5/S1 abutting the bilateral S1 nerve roots.  Modic changes at the L5/S1 endplate.   Patient name: Blake Hampton Patient MRN: [id number] Date of visit: 12/20/23

## 2024-01-03 ENCOUNTER — Other Ambulatory Visit: Payer: Self-pay

## 2024-01-03 ENCOUNTER — Ambulatory Visit (INDEPENDENT_AMBULATORY_CARE_PROVIDER_SITE_OTHER): Payer: MEDICAID | Admitting: Physical Medicine and Rehabilitation

## 2024-01-03 VITALS — BP 121/84 | HR 88

## 2024-01-03 DIAGNOSIS — R531 Weakness: Secondary | ICD-10-CM

## 2024-01-03 DIAGNOSIS — M48062 Spinal stenosis, lumbar region with neurogenic claudication: Secondary | ICD-10-CM | POA: Diagnosis not present

## 2024-01-03 DIAGNOSIS — M5416 Radiculopathy, lumbar region: Secondary | ICD-10-CM | POA: Diagnosis not present

## 2024-01-03 MED ORDER — METHYLPREDNISOLONE ACETATE 40 MG/ML IJ SUSP
40.0000 mg | Freq: Once | INTRAMUSCULAR | Status: AC
  Administered 2024-01-03: 40 mg

## 2024-01-03 NOTE — Progress Notes (Signed)
 Blake Hampton - 48 y.o. male MRN [id number]  Date of birth: 1975/11/27  Office Visit Note: Visit Date: 01/03/2024 PCP: Paseda, Folashade R, FNP Referred by: [name] Ozell LABOR, MD  Subjective: Chief Complaint  Patient presents with   Lower Back - Pain   HPI:  Blake Hampton is a 48 y.o. male who comes in today at the request of Dr. Ozell [name] for planned Right L5-S1 Lumbar Interlaminar epidural steroid injection with fluoroscopic guidance.  The patient has failed conservative care including home exercise, medications, time and activity modification.  However, the patient comes in today thinking he was not getting an injection.  He is really unsure if he wants to do an injection.  He is sitting in a wheelchair today holding his walker that he has been using.  He did drive himself here today.  It can be noted in the exam of the nurse practitioner and Dr. [name] that the patient has a lot of pain out of proportion for spine findings although he does have moderate to severe central stenosis at L4-5 with left more than right lateral recess narrowing but it is moderate.  Exam has been difficult to do as everything seems to hurt with effort.  Today as noted in the exam he does have more ability to flex and extend his left foot with good strength and is really not able to give effort on the right.  There is been no changes recently with progressive symptoms although continued pain and inability to move well.  He is diabetic but his hemoglobin A1c numbers have been in the 6-7 range.  He reports somewhat at times of saddle anesthesia but does not report any bowel or bladder incontinence.  He would like to see a neurosurgeon for evaluation.   I spent more than 30 minutes speaking face-to-face with the patient with 50% of the time in counseling and discussing coordination of care.    Review of Systems  Musculoskeletal:  Positive for back pain and joint pain.  Neurological:  Positive for tingling, focal  weakness and weakness.  All other systems reviewed and are negative.  Otherwise per HPI.  Assessment & Plan: Visit Diagnoses:    ICD-10-CM   1. Lumbar radiculopathy  M54.16 methylPREDNISolone  acetate (DEPO-MEDROL ) injection 40 mg    Ambulatory referral to Neurosurgery    CANCELED: XR C-ARM NO REPORT    CANCELED: Epidural Steroid injection    2. Spinal stenosis of lumbar region with neurogenic claudication  M48.062 Ambulatory referral to Neurosurgery    3. Weakness  R53.1 Ambulatory referral to Neurosurgery      Plan: Findings:  At this point I will refer him to Dr. Onetha at [facility] neurosurgery.  This will be for an evaluation and to see if surgical outcome is L4-5 would be beneficial or doable.  Exam is difficult do to the amount of pain he is having even with movement.  He is able to drive himself here however.  No concerning neurogenic findings although again a hard to totally examine.    Meds & Orders:  Meds ordered this encounter  Medications   methylPREDNISolone  acetate (DEPO-MEDROL ) injection 40 mg    Orders Placed This Encounter  Procedures   Ambulatory referral to Neurosurgery    Follow-up: Return for visit to requesting provider as needed.   Procedures: No procedures performed      Clinical History: MRI LUMBAR SPINE WITHOUT CONTRAST   TECHNIQUE: Multiplanar, multisequence MR imaging of the lumbar spine was  performed. No intravenous contrast was administered.   COMPARISON:  09/22/2019   FINDINGS: Segmentation: The lowest lumbar type non-rib-bearing vertebra is labeled as L5.   Alignment:  3 mm degenerative retrolisthesis at L2-3 and L3-4.   Vertebrae: Disc desiccation at all levels between L2 and S1 with loss of disc height but relative spurring of L4-5.   Type 2 degenerative endplate findings at L5-S1.   Conus medullaris and cauda equina: Conus extends to the upper L2 level. Conus and cauda equina appear normal.   Paraspinal and other soft tissues:  Unremarkable   Disc levels:   T12-L1: Unremarkable   L1-2: Unremarkable   L2-3: Mild central narrowing of the thecal sac due to disc bulge. This is minimally worsened from prior.   L3-4: Mild bilateral foraminal stenosis with mild displacement of both L3 nerves in the lateral extraforaminal space along with borderline central narrowing of the thecal sac due to disc bulge, intervertebral spurring, and facet arthropathy. Minimally worsened from prior.   L4-5: Moderate to prominent central narrowing of the thecal sac with moderate left and mild right subarticular lateral recess stenosis due to disc bulge and central disc protrusion, worsened from prior.   L5-S1: Moderate left and mild right foraminal stenosis mild left subarticular lateral recess stenosis due to central and left paracentral disc protrusion, intervertebral spurring, facet arthropathy, and disc bulge.   IMPRESSION: 1. Lumbar spondylosis and degenerative disc disease, causing moderate to prominent impingement at L4-5; moderate impingement at L5-S1; mild impingement at L2-3 and L3-4 as noted above. Impingement at several levels is worsened from prior, particularly at L4-5.     Electronically Signed   By: Ryan Salvage M.D.   On: 05/18/2023 14:00     Objective:  VS:  HT:    WT:   BMI:     BP:121/84  HR:88bpm  TEMP: ( )  RESP:  Physical Exam Vitals and nursing note reviewed.  Constitutional:      General: He is not in acute distress.    Appearance: Normal appearance. He is obese. He is not ill-appearing.  HENT:     Head: Normocephalic and atraumatic.     Right Ear: External ear normal.     Left Ear: External ear normal.     Nose: No congestion.  Eyes:     Extraocular Movements: Extraocular movements intact.  Cardiovascular:     Rate and Rhythm: Normal rate.     Pulses: Normal pulses.  Pulmonary:     Effort: Pulmonary effort is normal. No respiratory distress.  Abdominal:     General: There  is no distension.     Palpations: Abdomen is soft.  Musculoskeletal:        General: Tenderness present. No signs of injury.     Cervical back: Neck supple.     Right lower leg: No edema.     Left lower leg: No edema.     Comments: As noted in the HPI and the patient sits in a wheelchair for manner also has been carrying his walker.  Any exam of the lower extremities elicits pain.  He has a difficult time with commands to move his limbs to get a good strength evaluation.  With some distraction the left dorsiflexion plantarflexion is intact and unable to really see the right as he has a lot of pain with any movement there.  Skin:    Findings: No erythema or rash.  Neurological:     General: No focal deficit present.  Mental Status: He is alert and oriented to person, place, and time.     Cranial Nerves: No cranial nerve deficit.     Sensory: No sensory deficit.     Motor: Weakness present. No abnormal muscle tone.     Coordination: Coordination normal.     Gait: Gait abnormal.  Psychiatric:        Mood and Affect: Mood normal.        Behavior: Behavior normal.      Imaging: No results found.

## 2024-01-03 NOTE — Progress Notes (Signed)
 Pain Scale   Average Pain 7 Patient advising he has chronic lower back pain without relief.Pain is constant.        +Driver, -BT, -Dye Allergies.

## 2024-01-10 ENCOUNTER — Encounter: Payer: Self-pay | Admitting: Physical Medicine and Rehabilitation

## 2024-03-15 ENCOUNTER — Ambulatory Visit: Payer: MEDICAID | Admitting: Orthopedic Surgery

## 2024-03-15 DIAGNOSIS — M5416 Radiculopathy, lumbar region: Secondary | ICD-10-CM | POA: Diagnosis not present

## 2024-03-15 NOTE — Progress Notes (Signed)
 Orthopedic Spine Surgery Office Note   Assessment: Patient is a 49 y.o. male with low back pain that radiates into the right lower extremity along the posterior aspect of the thigh and leg     Plan: -Went over surgery as an option for the patient in the form of right side hemilaminotomy and microdiscectomy at L4/5. I told him that he would most likely have significant and residual pain after surgery given the duration of his pain and the degree of pain. I explained that I do not think his back spasms or back pain would improve with this surgery. His radiating right leg pain is what would probably get some improvement but again would not be complete relief due to the duration and severity of his pain. I covered the risks of surgery with him. He was going to think about it more and let me know. I talked about pain management as a different option. He said he'll think about that too and contact me when he decides how he would like to proceed     Patient expressed understanding of the plan and all questions were answered to the patient's satisfaction.    ___________________________________________________________________________     History:   Patient is a 49 y.o. male who presents today for follow up on his lumbar spine. Patient has had over 9 years of low back pain that radiates into the right lower extremity. He feels it along the posterolateral aspect of the thigh and leg. He also feels it in the anterior thigh and leg. He has significant back pain and feels spasms in his back.  His back pain is usually more severe than his leg pain. My partner sent him to a neurosurgeon after my last visit with the patient and the patient reports a similar prognosis for surgery was discussed and the other surgeon referred him back to me (I cannot see their notes in care everywhere). He has not developed any new symptoms since he was last seen in the office. No bowel or bladder incontinence. No saddle anesthesia. No  unsteadiness or instability with gait but sometimes says when his back spasms get really bad he nearly falls to the ground.      Treatments tried: PT, massage, tylenol , ibuprofen , robaxin , oxycodone      Physical Exam:   General: no acute distress, appears stated age Neurologic: alert, answering questions appropriately, following commands Respiratory: unlabored breathing on room air, symmetric chest rise Psychiatric: appropriate affect, normal cadence to speech     MSK (spine):   -Strength exam                                                   Left                  Right EHL                              4/5                  3/5 TA                                 4/5  3/5 GSC                             4/5                  3/5 Knee extension            4/5                  3/5 Hip flexion                    4/5                  4/5   Exam is similar to last visit - very limited by participation and pain. His back pain is aggravated with plantarflexion and dorsiflexion at the ankle (this was the case with knee extended and knee flexed)   -Sensory exam                           Sensation intact to light touch in L3-S1 nerve distributions of bilateral lower extremities   -Achilles DTR: 1/4 on the left, 1/4 on the right -Patellar tendon DTR: 1/4 on the left, 1/4 on the right -Pain in his back with SLR bilaterally but does not worsen radiating leg pain   Imaging: XRs of the lumbar spine from 12/20/2023 were previously independently reviewed and interpreted, showing disc height loss at L5/S1.  No other significant degenerative changes seen. No evidence of instability on flexion/extension views. No fracture or dislocation seen.   MRI of the lumbar spine from 05/11/2023 was previously independently reviewed and interpreted, showing central stenosis at L3/4.  Left paracentral disc herniation at L4/5 causing lateral recess stenosis.  Central disc herniation at L5/S1 abutting  the bilateral S1 nerve roots.  Modic changes at the L5/S1 endplate.     Patient name: Blake Hampton Patient MRN: [id number] Date of visit: 03/15/24

## 2024-03-29 ENCOUNTER — Telehealth: Payer: Self-pay | Admitting: Orthopedic Surgery

## 2024-03-29 NOTE — Telephone Encounter (Signed)
 Patient request a letter for the [place] [employer] [place] that states his limitation and that he can not pull his trash can to the road.

## 2024-03-31 ENCOUNTER — Telehealth: Payer: Self-pay | Admitting: Orthopedic Surgery

## 2024-03-31 NOTE — Telephone Encounter (Signed)
 FYI----I called and spoke with Blake Hampton. I advised that he does not have an appt Monday, he states that is not what he is calling about. He states that he is not sure how to explain that he needs something stating he is not able to take his trash can to the street, so is wanting to continue the [employer] (where the city trash guys gets his trash to the truck and empty it bring it back to the side of his house. I advised that per Dr. [name] note earlier, we will not write the note for him, as this is something that has can do. He wanted to schedule an appoint with Dr. [name] to discuss surgery.  Patient kept repeating himself about wanting the and I kept advising him of the previous message from the Dr. [name] made 04/26/24 @ 245.

## 2024-03-31 NOTE — Telephone Encounter (Signed)
 Pt called to reschedule his apt on Monday. Call back number is [phone].

## 2024-03-31 NOTE — Telephone Encounter (Signed)
 I called and lmom advising him of Dr. [name] message.

## 2024-04-26 ENCOUNTER — Ambulatory Visit: Payer: MEDICAID | Admitting: Orthopedic Surgery
